# Patient Record
Sex: Male | Born: 1990
Health system: Southern US, Community
[De-identification: ages and names within clinical notes are randomized; demographics above are authoritative.]

## PROBLEM LIST (undated history)

## (undated) DIAGNOSIS — F419 Anxiety disorder, unspecified: Secondary | ICD-10-CM

## (undated) HISTORY — PX: SHOULDER SURGERY: SHX246

## (undated) HISTORY — PX: WISDOM TOOTH EXTRACTION: SHX21

## (undated) HISTORY — PX: KNEE SURGERY: SHX244

---

## 2015-06-16 ENCOUNTER — Emergency Department: Payer: No Typology Code available for payment source

## 2015-06-16 ENCOUNTER — Emergency Department
Admission: EM | Admit: 2015-06-16 | Discharge: 2015-06-16 | Disposition: A | Discharge status: Home / Self Care | Payer: No Typology Code available for payment source | Attending: Emergency Medicine | Admitting: Emergency Medicine

## 2015-06-16 ENCOUNTER — Encounter: Payer: Self-pay | Admitting: Emergency Medicine

## 2015-06-16 DIAGNOSIS — F17200 Nicotine dependence, unspecified, uncomplicated: Secondary | ICD-10-CM | POA: Diagnosis not present

## 2015-06-16 DIAGNOSIS — R1010 Upper abdominal pain, unspecified: Secondary | ICD-10-CM | POA: Diagnosis present

## 2015-06-16 DIAGNOSIS — K2970 Gastritis, unspecified, without bleeding: Secondary | ICD-10-CM | POA: Diagnosis not present

## 2015-06-16 LAB — COMPREHENSIVE METABOLIC PANEL
ALT: 53 U/L (ref 17–63)
AST: 36 U/L (ref 15–41)
Albumin: 4.8 g/dL (ref 3.5–5.0)
Alkaline Phosphatase: 99 U/L (ref 38–126)
Anion gap: 9 (ref 5–15)
BUN: 11 mg/dL (ref 6–20)
CO2: 23 mmol/L (ref 22–32)
Calcium: 9.6 mg/dL (ref 8.9–10.3)
Chloride: 108 mmol/L (ref 101–111)
Creatinine, Ser: 0.74 mg/dL (ref 0.61–1.24)
GFR calc Af Amer: >60 mL/min (ref >60)
GFR calc non Af Amer: >60 mL/min (ref >60)
Glucose, Bld: 102 mg/dL — ABNORMAL HIGH (ref 65–99)
Potassium: 3.2 mmol/L — ABNORMAL LOW (ref 3.5–5.1)
Sodium: 140 mmol/L (ref 135–145)
Total Bilirubin: 0.9 mg/dL (ref 0.3–1.2)
Total Protein: 7.4 g/dL (ref 6.5–8.1)

## 2015-06-16 LAB — CBC
HCT: 46.9 % (ref 40.0–52.0)
Hemoglobin: 16.2 g/dL (ref 13.0–18.0)
MCH: 30.4 pg (ref 26.0–34.0)
MCHC: 34.6 g/dL (ref 32.0–36.0)
MCV: 87.8 fL (ref 80.0–100.0)
Platelets: 244 10*3/uL (ref 150–440)
RBC: 5.34 MIL/uL (ref 4.40–5.90)
RDW: 12.5 % (ref 11.5–14.5)
WBC: 19.3 10*3/uL — ABNORMAL HIGH (ref 3.8–10.6)

## 2015-06-16 LAB — URINALYSIS COMPLETE WITH MICROSCOPIC (ARMC ONLY)
Bacteria, UA: NONE SEEN
Bilirubin Urine: NEGATIVE
Glucose, UA: NEGATIVE mg/dL
Hgb urine dipstick: NEGATIVE
Ketones, ur: NEGATIVE mg/dL
Leukocytes, UA: NEGATIVE
Nitrite: NEGATIVE
Protein, ur: NEGATIVE mg/dL
Specific Gravity, Urine: 1.018 (ref 1.005–1.030)
pH: 7 (ref 5.0–8.0)

## 2015-06-16 LAB — LIPASE, BLOOD: Lipase: 25 U/L (ref 11–51)

## 2015-06-16 MED ORDER — SODIUM CHLORIDE 0.9 % IV BOLUS (SEPSIS)
1000.0000 mL | Freq: Once | INTRAVENOUS | Status: AC
  Administered 2015-06-16: 1000 mL via INTRAVENOUS

## 2015-06-16 MED ORDER — HYDROMORPHONE HCL 1 MG/ML IJ SOLN
1.0000 mg | Freq: Once | INTRAMUSCULAR | Status: AC
  Administered 2015-06-16: 1 mg via INTRAVENOUS
  Filled 2015-06-16: qty 1

## 2015-06-16 MED ORDER — ONDANSETRON 4 MG PO TBDP: 4.0000 mg | ORAL_TABLET | Freq: Three times a day (TID) | ORAL | Status: DC | PRN

## 2015-06-16 MED ORDER — PROMETHAZINE HCL 25 MG/ML IJ SOLN
INTRAMUSCULAR | Status: AC
  Administered 2015-06-16: 25 mg via INTRAVENOUS
  Filled 2015-06-16: qty 1

## 2015-06-16 MED ORDER — PANTOPRAZOLE SODIUM 40 MG PO TBEC: 40.0000 mg | DELAYED_RELEASE_TABLET | Freq: Every day | ORAL | Status: DC

## 2015-06-16 MED ORDER — GI COCKTAIL ~~LOC~~
ORAL | Status: AC
  Administered 2015-06-16: 30 mL via ORAL
  Filled 2015-06-16: qty 30

## 2015-06-16 MED ORDER — MORPHINE SULFATE (PF) 4 MG/ML IV SOLN
INTRAVENOUS | Status: AC
  Administered 2015-06-16: 4 mg via INTRAVENOUS
  Filled 2015-06-16: qty 1

## 2015-06-16 MED ORDER — OXYCODONE-ACETAMINOPHEN 5-325 MG PO TABS: 1.0000 | ORAL_TABLET | Freq: Four times a day (QID) | ORAL | Status: DC | PRN

## 2015-06-16 MED ORDER — MORPHINE SULFATE (PF) 4 MG/ML IV SOLN
4.0000 mg | Freq: Once | INTRAVENOUS | Status: AC
  Administered 2015-06-16: 4 mg via INTRAVENOUS

## 2015-06-16 MED ORDER — GI COCKTAIL ~~LOC~~
30.0000 mL | Freq: Once | ORAL | Status: AC
  Administered 2015-06-16: 30 mL via ORAL

## 2015-06-16 MED ORDER — PROMETHAZINE HCL 25 MG/ML IJ SOLN
25.0000 mg | Freq: Once | INTRAMUSCULAR | Status: AC
  Administered 2015-06-16: 25 mg via INTRAVENOUS
  Filled 2015-06-16: qty 1

## 2015-06-16 NOTE — ED Provider Notes (Signed)
[facility] Emergency Department Provider Note  Time seen: 4:49 PM  I have reviewed the triage vital signs and the nursing notes.   HISTORY  Chief Complaint Abdominal Pain    HPI Brett [name] is a 24 y.o. male with no past medical history who presents the emergency department for abdominal pain, nausea and vomiting. According to the patient and [place], however over the past one month the patient has been experiencing upper abdominal pain, nausea and vomiting. States symptoms initially began around Thanksgiving where he was seen in the emergency department twice, without a diagnosis. States he had a CT scan with contrast which showed normal results. He then had an ultrasound showing a normal gallbladder. Patient states he was then admitted to the hospital and had a HIDA scan which showed normal results. He states this could be gastritis of a sent home with GI follow-up, and the patient followed up with GI as an outpatient where he had an endoscopy, which per patient showed normal results but they did tell him to limit his Advil/ibuprofen use. Patient states his symptoms have been present but had been mild over the past several weeks until today when his symptoms have flared up again. Prior to Thanksgiving the patient denies any history of this happening. Denies fever or diarrhea. Denies obvious hematuria or dysuria. No history of kidney stones. Patient states occasional alcohol use. Occasional marijuana use. Patient has discontinued his NSAID use, but still with symptoms. Describes his pain as severe located in the epigastrium and right upper quadrant.     History reviewed. No pertinent past medical history.  There are no active problems to display for this patient.   History reviewed. No pertinent past surgical history.  No current outpatient prescriptions on file.  Allergies Tamiflu  History reviewed. No pertinent family  history.  Social History Social History  Substance Use Topics  . Smoking status: Current Every Day Smoker  . Smokeless tobacco: None  . Alcohol Use: None    Review of Systems Constitutional: Negative for fever. Cardiovascular: Negative for chest pain. Respiratory: Negative for shortness of breath. Gastrointestinal: Positive for upper abdominal pain. Positive for nausea and vomiting. Negative for diarrhea or constipation. Genitourinary: Negative for dysuria. Negative for hematuria. Musculoskeletal: Negative for back pain Neurological: Negative for headache 10-point ROS otherwise negative.  ____________________________________________   PHYSICAL EXAM:  VITAL SIGNS: ED Triage Vitals  Enc Vitals Group     BP 06/16/15 1552 128/104 mmHg     Pulse Rate 06/16/15 1552 115     Resp 06/16/15 1552 24     Temp 06/16/15 1552 97.6 F (36.4 C)     Temp Source 06/16/15 1552 Oral     SpO2 06/16/15 1552 99 %     Weight 06/16/15 1552 190 lb (86.183 kg)     Height 06/16/15 1552  (1.88 m)     Head Cir --      Peak Flow --      Pain Score 06/16/15 1553 10     Pain Loc --      Pain Edu? --      Excl. in GC? --     Constitutional: Alert and oriented. Moderate distress due to pain. Eyes: Normal exam ENT   Head: Normocephalic and atraumatic   Mouth/Throat: Mucous membranes are moist. Cardiovascular: Normal rate, regular rhythm. No murmur Respiratory: Normal respiratory effort without tachypnea nor retractions. Breath sounds are clear and equal bilaterally. No wheezes/rales/rhonchi. Gastrointestinal:  Soft, moderate epigastric tenderness palpation. No rebound or guarding. No distention. Musculoskeletal: Nontender with normal range of motion in all extremities. Neurologic:  Normal speech and language. No gross focal neurologic deficits Skin:  Skin is warm, dry and intact.  Psychiatric: Mood and affect are normal. Speech and behavior are normal.    ____________________________________________     RADIOLOGY  CT shows no acute abnormality, 4 mm right lung nodule discussed with patient.  ____________________________________________    INITIAL IMPRESSION / ASSESSMENT AND PLAN / ED COURSE  Pertinent labs & imaging results that were available during my care of the patient were reviewed by me and considered in my medical decision making (see chart for details).  She presents for 1 month of epigastric pain which has been intermittent, but much more severe today also associated with nausea and vomiting. Patient appears to have had a very extensive workup including CT scan with contrast, ultrasound of his gallbladder, HIDA scan, endoscopy, all showing normal results per patient. Patient's labs today show a fairly elevated white blood cell count of 19,000, the rest of his labs are within normal limits including his LFTs. Lipase is normal. Currently awaiting urinalysis results. We'll treat the patient's pain and discomfort, and continue to closely monitor him in the emergency department.  Pain and nausea are much improved at this time. We will discharge the patient home with Protonix, Percocet and Zofran. I discussed with the patient and girlfriend the need to take Maalox for symptom control as well. Patient's follow-up with GI medicine when he returns back home. Discussed strict return precautions to which they are agreeable. Likely Gastritis.  ____________________________________________   FINAL CLINICAL IMPRESSION(S) / ED DIAGNOSES  Epigastric pain Gastritis  Minna AntisKevin Takeem Krotzer, MD 06/16/15 [phone]

## 2015-06-16 NOTE — Discharge Instructions (Signed)

## 2015-06-16 NOTE — ED Notes (Signed)
EMS pt to the lobby for RUQ abd pain , was given 4 mg zofran by EMS , recent hx of same with eval at a different hospital

## 2018-09-26 ENCOUNTER — Ambulatory Visit
Admission: EM | Admit: 2018-09-26 | Discharge: 2018-09-26 | Disposition: A | Discharge status: Home / Self Care | Payer: BLUE CROSS/BLUE SHIELD | Attending: Emergency Medicine | Admitting: Emergency Medicine

## 2018-09-26 ENCOUNTER — Encounter: Payer: Self-pay | Admitting: Emergency Medicine

## 2018-09-26 ENCOUNTER — Other Ambulatory Visit: Payer: Self-pay

## 2018-09-26 ENCOUNTER — Ambulatory Visit (INDEPENDENT_AMBULATORY_CARE_PROVIDER_SITE_OTHER)
Admit: 2018-09-26 | Discharge: 2018-09-26 | Disposition: A | Discharge status: Home / Self Care | Payer: BLUE CROSS/BLUE SHIELD | Attending: Family Medicine | Admitting: Family Medicine

## 2018-09-26 DIAGNOSIS — R1011 Right upper quadrant pain: Secondary | ICD-10-CM | POA: Diagnosis not present

## 2018-09-26 DIAGNOSIS — R112 Nausea with vomiting, unspecified: Secondary | ICD-10-CM | POA: Diagnosis not present

## 2018-09-26 DIAGNOSIS — F17210 Nicotine dependence, cigarettes, uncomplicated: Secondary | ICD-10-CM | POA: Diagnosis not present

## 2018-09-26 LAB — CBC WITH DIFFERENTIAL/PLATELET
Abs Immature Granulocytes: 0.03 10*3/uL (ref 0.00–0.07)
Basophils Absolute: 0 10*3/uL (ref 0.0–0.1)
Basophils Relative: 0 %
Eosinophils Absolute: 0.1 10*3/uL (ref 0.0–0.5)
Eosinophils Relative: 1 %
HCT: 47.6 % (ref 39.0–52.0)
Hemoglobin: 16.6 g/dL (ref 13.0–17.0)
Immature Granulocytes: 0 %
Lymphocytes Relative: 15 %
Lymphs Abs: 1.5 10*3/uL (ref 0.7–4.0)
MCH: 31.1 pg (ref 26.0–34.0)
MCHC: 34.9 g/dL (ref 30.0–36.0)
MCV: 89.3 fL (ref 80.0–100.0)
Monocytes Absolute: 0.6 10*3/uL (ref 0.1–1.0)
Monocytes Relative: 6 %
Neutro Abs: 7.9 10*3/uL — ABNORMAL HIGH (ref 1.7–7.7)
Neutrophils Relative %: 78 %
Platelets: 278 10*3/uL (ref 150–400)
RBC: 5.33 MIL/uL (ref 4.22–5.81)
RDW: 11.9 % (ref 11.5–15.5)
WBC: 10.1 10*3/uL (ref 4.0–10.5)
nRBC: 0 % (ref 0.0–0.2)

## 2018-09-26 LAB — COMPREHENSIVE METABOLIC PANEL
ALT: 49 U/L — ABNORMAL HIGH (ref 0–44)
AST: 33 U/L (ref 15–41)
Albumin: 4.9 g/dL (ref 3.5–5.0)
Alkaline Phosphatase: 90 U/L (ref 38–126)
Anion gap: 9 (ref 5–15)
BUN: 14 mg/dL (ref 6–20)
CO2: 25 mmol/L (ref 22–32)
Calcium: 9.4 mg/dL (ref 8.9–10.3)
Chloride: 105 mmol/L (ref 98–111)
Creatinine, Ser: 0.8 mg/dL (ref 0.61–1.24)
GFR calc Af Amer: >60 mL/min (ref >60)
GFR calc non Af Amer: >60 mL/min (ref >60)
Glucose, Bld: 94 mg/dL (ref 70–99)
Potassium: 4.2 mmol/L (ref 3.5–5.1)
Sodium: 139 mmol/L (ref 135–145)
Total Bilirubin: 1 mg/dL (ref 0.3–1.2)
Total Protein: 7.8 g/dL (ref 6.5–8.1)

## 2018-09-26 LAB — LIPASE, BLOOD: Lipase: 23 U/L (ref 11–51)

## 2018-09-26 MED ORDER — ONDANSETRON 4 MG PO TBDP: 4.0000 mg | ORAL_TABLET | Freq: Three times a day (TID) | ORAL | 0 refills | Status: DC | PRN

## 2018-09-26 NOTE — Discharge Instructions (Signed)
Take medication as prescribed. Rest. Drink plenty of fluids. Low fat diet.   Follow up with Gastroenterology this week as discussed. See above to call to schedule.  Follow up with your primary care physician this week as needed. Return to Urgent care for new or worsening concerns.

## 2018-09-26 NOTE — ED Provider Notes (Signed)
[facility] ____________________________________________  Time seen: Approximately 10:33 AM  I have reviewed the triage vital signs and the nursing notes.   HISTORY  Chief Complaint Abdominal Pain and Emesis   HPI Brett [name] is a 28 y.o. male presenting for evaluation of right upper quadrant abdominal pain and vomiting.  States yesterday morning after eating he began having right upper quadrant pain within an hour with followed vomiting.  Patient states that since then each time he eats he will have right upper quadrant abdominal pain within 1 hour, with nausea following and possible vomiting.  States if he does not eat or drink he feels better with only a very mild dull ache to right upper quadrant.  States minimal dull pain to right upper quadrant currently.  No current nausea.  Last bowel movement within the last 2 days and described as normal.  Denies abnormal colored vomit.  Previous history of gastritis with epigastric pain, states current feels different.  Denies known sick contacts.  No accompanying cough, fever, sore throat, chest pain, shortness of breath or other complaints.  No dysuria.  Denies pain radiation or flank pain.  No alleviating measures attempted otherwise.  Denies other aggravating factors.  Does not drink alcohol.   pertinent past medical history Gastritis   There are no active problems to display for this patient.   Past Surgical History:  Procedure Laterality Date  . KNEE SURGERY Bilateral   . SHOULDER SURGERY Left   . WISDOM TOOTH EXTRACTION       No current facility-administered medications for this encounter.   Current Outpatient Medications:  .  pantoprazole (PROTONIX) 40 MG tablet, Take 1 tablet (40 mg total) by mouth daily., Disp: 30 tablet, Rfl: 1 .  ondansetron (ZOFRAN ODT) 4 MG disintegrating tablet, Take 1 tablet (4 mg total) by mouth every 8 (eight) hours as needed., Disp: 15 tablet, Rfl: 0  Allergies Tamiflu [oseltamivir  phosphate]  Family History  Problem Relation Age of Onset  . Hypertension Mother   . Hypertension Father   . Diabetes Father   Mother cholecystectomy Sister cholecystectomy  Social History Social History   Tobacco Use  . Smoking status: Current Every Day Smoker    Types: Cigarettes  . Smokeless tobacco: Current User    Types: Chew  Substance Use Topics  . Alcohol use: Not Currently  . Drug use: Yes    Types: Marijuana    Review of Systems Constitutional: No fever Cardiovascular: Denies chest pain. Respiratory: Denies shortness of breath. Gastrointestinal: As above.  Genitourinary: Negative for dysuria. Musculoskeletal: Negative for back pain. Skin: Negative for rash.  ____________________________________________   PHYSICAL EXAM:  VITAL SIGNS: ED Triage Vitals  Enc Vitals Group     BP 09/26/18 0952 (!) 127/92     Pulse Rate 09/26/18 0952 82     Resp 09/26/18 0952 16     Temp 09/26/18 0952 98.4 F (36.9 C)     Temp Source 09/26/18 0952 Oral     SpO2 09/26/18 0952 100 %     Weight 09/26/18 0947 217 lb (98.4 kg)     Height 09/26/18 0947 6' (1.829 m)     Head Circumference --      Peak Flow --      Pain Score 09/26/18 0947 6     Pain Loc --      Pain Edu? --      Excl. in GC? --     Constitutional: Alert and oriented. Well appearing and  in no acute distress. ENT      Head: Normocephalic and atraumatic. Cardiovascular: Normal rate, regular rhythm. Grossly normal heart sounds.  Good peripheral circulation. Respiratory: Normal respiratory effort without tachypnea nor retractions. Breath sounds are clear and equal bilaterally. No wheezes, rales, rhonchi. Gastrointestinal:  Normal Bowel sounds.  No distention.  Moderate right upper quadrant tenderness with positive Murphys.  Abdomen otherwise soft and nontender.  No CVA tenderness. Musculoskeletal: No midline cervical, thoracic or lumbar tenderness to palpation.  Neurologic:  Normal speech and language. Speech  is normal. No gait instability.  Skin:  Skin is warm, dry and intact. No rash noted. Psychiatric: Mood and affect are normal. Speech and behavior are normal. Patient exhibits appropriate insight and judgment   ___________________________________________   LABS (all labs ordered are listed, but only abnormal results are displayed)  Labs Reviewed  CBC WITH DIFFERENTIAL/PLATELET - Abnormal; Notable for the following components:      Result Value   Neutro Abs 7.9 (*)    All other components within normal limits  COMPREHENSIVE METABOLIC PANEL - Abnormal; Notable for the following components:   ALT 49 (*)    All other components within normal limits  LIPASE, BLOOD    RADIOLOGY  US Abdomen Limited Ruq  Result Date: 09/26/2018 CLINICAL DATA:  Right upper quadrant pain, nausea and vomiting for 1 day. EXAM: ULTRASOUND ABDOMEN LIMITED RIGHT UPPER QUADRANT COMPARISON:  None. FINDINGS: Gallbladder: No gallstones or wall thickening visualized. No sonographic Murphy sign noted by sonographer. Common bile duct: Diameter: 0.4 cm. Liver: No focal lesion identified. Within normal limits in parenchymal echogenicity. Portal vein is patent on color Doppler imaging with normal direction of blood flow towards the liver. IMPRESSION: Negative exam. Electronically Signed   By: Drusilla Kanner M.D.   On: 09/26/2018 11:23   ____________________________________________   PROCEDURES Procedures   INITIAL IMPRESSION / ASSESSMENT AND PLAN / ED COURSE  Pertinent labs & imaging results that were available during my care of the patient were reviewed by me and considered in my medical decision making (see chart for details).  Well-appearing patient.  No acute distress.  Presenting for right upper quadrant abdominal pain with associated nausea and vomiting after eating.  Labs reviewed and overall unremarkable.  Right upper quadrant ultrasound performed and negative exam.  Abdomen otherwise soft and nontender.   Patient has minimal discomfort to right upper quadrant at this time.  Recommend for patient to have supportive care, Zofran as needed, decrease fat in diet and follow-up with gastroenterology due to concern gall pain.  Information given.  Work note given.  Discussed very strict follow-up and return parameters, proceed directly to the emergency room for increased pain.Discussed indication, risks and benefits of medications with patient.  Discussed follow up with Primary care physician this week. Discussed follow up and return parameters including no resolution or any worsening concerns. Patient verbalized understanding and agreed to plan.   ____________________________________________   FINAL CLINICAL IMPRESSION(S) / ED DIAGNOSES  Final diagnoses:  RUQ abdominal pain  Non-intractable vomiting with nausea, unspecified vomiting type     ED Discharge Orders         Ordered    ondansetron (ZOFRAN ODT) 4 MG disintegrating tablet  Every 8 hours PRN     09/26/18 1136           Note: This dictation was prepared with Dragon dictation along with smaller phrase technology. Any transcriptional errors that result from this process are unintentional.  Renford Dills, NP 09/26/18 1153

## 2018-09-26 NOTE — ED Triage Notes (Signed)
Patient states that yesterday morning when he was eating breakfast he started to have some right upper quadrant abdominal pain.  Patient also states that he vomited after he ate.  Patient states that his pain is worse after he eats.  Patient denies fevers.  Patient denies diarrhea.

## 2019-01-26 ENCOUNTER — Ambulatory Visit
Admission: EM | Admit: 2019-01-26 | Discharge: 2019-01-26 | Disposition: A | Discharge status: Home / Self Care | Payer: BLUE CROSS/BLUE SHIELD | Attending: Family Medicine | Admitting: Family Medicine

## 2019-01-26 ENCOUNTER — Other Ambulatory Visit: Payer: Self-pay

## 2019-01-26 ENCOUNTER — Encounter: Payer: Self-pay | Admitting: Emergency Medicine

## 2019-01-26 DIAGNOSIS — Z7189 Other specified counseling: Secondary | ICD-10-CM

## 2019-01-26 DIAGNOSIS — Z20822 Contact with and (suspected) exposure to covid-19: Secondary | ICD-10-CM

## 2019-01-26 DIAGNOSIS — Z20828 Contact with and (suspected) exposure to other viral communicable diseases: Secondary | ICD-10-CM

## 2019-01-26 NOTE — Discharge Instructions (Signed)
Refer to [facility] information and follow.  Monitor.  Await test results.  Remain home.  Follow up with your primary care physician this week as needed. Return to Urgent care for new or worsening concerns.

## 2019-01-26 NOTE — ED Provider Notes (Signed)
[facility] ____________________________________________  Time seen: Approximately 12:49 PM  I have reviewed the triage vital signs and the nursing notes.   HISTORY  Chief Complaint COVID Test   HPI Brett [name] is a 28 y.o. male presenting for COVID-19 testing.  Patient reports his wife was directly exposed to a positive contact through her work and he has been around his wife.  Patient denies any symptoms.  No fevers.Denies any nasal congestion, sore throat, fevers, chest pain, shortness of breath, changes in taste or smell, vomiting or diarrhea.  Has continued remain active.  Continues eat and drink well.  Denies pain or complaints at this time.   History reviewed. No pertinent past medical history.  There are no active problems to display for this patient.   Past Surgical History:  Procedure Laterality Date  . KNEE SURGERY Bilateral   . SHOULDER SURGERY Left   . WISDOM TOOTH EXTRACTION       No current facility-administered medications for this encounter.  No current outpatient medications on file.  Allergies Tamiflu [oseltamivir phosphate]  Family History  Problem Relation Age of Onset  . Hypertension Mother   . Hypertension Father   . Diabetes Father     Social History Social History   Tobacco Use  . Smoking status: Current Every Day Smoker    Types: Cigarettes  . Smokeless tobacco: Current User    Types: Chew  Substance Use Topics  . Alcohol use: Not Currently  . Drug use: Yes    Types: Marijuana    Review of Systems Constitutional: No fever ENT: No sore throat. Cardiovascular: Denies chest pain. Respiratory: Denies shortness of breath. Gastrointestinal: No abdominal pain.  No nausea, no vomiting.  No diarrhea.  Genitourinary: Negative for dysuria. Musculoskeletal: Negative for back pain. Skin: Negative for rash.   ____________________________________________   PHYSICAL EXAM:  VITAL SIGNS: ED Triage Vitals  Enc Vitals  Group     BP 01/26/19 1221 124/88     Pulse Rate 01/26/19 1221 68     Resp 01/26/19 1221 16     Temp 01/26/19 1221 98.2 F (36.8 C)     Temp Source 01/26/19 1221 Oral     SpO2 01/26/19 1221 100 %     Weight 01/26/19 1219 190 lb (86.2 kg)     Height 01/26/19 1219 6' (1.829 m)     Head Circumference --      Peak Flow --      Pain Score 01/26/19 1219 0     Pain Loc --      Pain Edu? --      Excl. in [place]? --     Constitutional: Alert and oriented. Well appearing and in no acute distress. Eyes: Conjunctivae are normal. ENT      Head: Normocephalic and atraumatic. Cardiovascular: Normal rate, regular rhythm. Grossly normal heart sounds.  Good peripheral circulation. Respiratory: Normal respiratory effort without tachypnea nor retractions. Breath sounds are clear and equal bilaterally. No wheezes, rales, rhonchi. Musculoskeletal: Steady gait.  Neurologic:  Normal speech and language.Speech is normal. No gait instability.  Skin:  Skin is warm, dry  Psychiatric: Mood and affect are normal. Speech and behavior are normal. Patient exhibits appropriate insight and judgment   ___________________________________________   LABS (all labs ordered are listed, but only abnormal results are displayed)  Labs Reviewed  NOVEL CORONAVIRUS, NAA (HOSPITAL ORDER, SEND-OUT TO REF LAB)    PROCEDURES Procedures    INITIAL IMPRESSION / ASSESSMENT AND PLAN / ED COURSE  Pertinent labs & imaging results that were available during my care of the patient were reviewed by me and considered in my medical decision making (see chart for details).  Well appearing patient.  Patient denies symptoms or complaints.  Positive exposure to COVID-19.  COVID-19 testing done.  [place] [facility] information given and instructed patient to await test results, remain home unless he can further care.  Monitor.   Discussed follow up and return parameters including no resolution or any worsening concerns. Patient verbalized  understanding and agreed to plan.   ____________________________________________   FINAL CLINICAL IMPRESSION(S) / ED DIAGNOSES  Final diagnoses:  Advice Given About Covid-19 Virus Infection  Exposure to Covid-19 Virus     ED Discharge Orders    None       Note: This dictation was prepared with Dragon dictation along with smaller phrase technology. Any transcriptional errors that result from this process are unintentional.         Renford DillsMiller, Clemmie Buelna, NP 01/26/19 1301

## 2019-01-26 NOTE — ED Triage Notes (Signed)
Patient states that he is here to be tested for the COVID 19 because his wife is being tested today and he needs a Negative test result before he can return to work.

## 2019-01-27 LAB — NOVEL CORONAVIRUS, NAA (HOSP ORDER, SEND-OUT TO REF LAB; TAT 18-24 HRS): SARS-CoV-2, NAA: NOT DETECTED

## 2019-01-28 ENCOUNTER — Encounter (HOSPITAL_COMMUNITY): Payer: Self-pay

## 2019-07-20 ENCOUNTER — Ambulatory Visit
Admission: EM | Admit: 2019-07-20 | Discharge: 2019-07-20 | Disposition: A | Discharge status: Home / Self Care | Payer: BLUE CROSS/BLUE SHIELD | Attending: Family Medicine | Admitting: Family Medicine

## 2019-07-20 ENCOUNTER — Encounter: Payer: Self-pay | Admitting: Emergency Medicine

## 2019-07-20 ENCOUNTER — Other Ambulatory Visit: Payer: Self-pay

## 2019-07-20 DIAGNOSIS — R112 Nausea with vomiting, unspecified: Secondary | ICD-10-CM

## 2019-07-20 DIAGNOSIS — J3489 Other specified disorders of nose and nasal sinuses: Secondary | ICD-10-CM | POA: Diagnosis not present

## 2019-07-20 DIAGNOSIS — Z20822 Contact with and (suspected) exposure to covid-19: Secondary | ICD-10-CM | POA: Diagnosis present

## 2019-07-20 MED ORDER — IPRATROPIUM BROMIDE 0.06 % NA SOLN: 2.0000 | Freq: Four times a day (QID) | NASAL | 0 refills | Status: DC | PRN

## 2019-07-20 NOTE — ED Provider Notes (Signed)
[facility]    CSN: [id number] Arrival date & time: 07/20/19  0904      History   Chief Complaint Chief Complaint  Patient presents with  . sinus pressure  . Nausea   HPI  29 year old Brett [name] presents with the above complaints.  Patient reports that he has had symptoms for the past 3 days.  He states that he has sinus pressure.  He describes a burning sensation in the nose.  He states that it feels like he has gotten water of his nose.  He has been trying over-the-counter medications without resolution.  He has had some nausea and vomiting today.  No fever.  No chills.  No known sick contacts.  No known exacerbating factors.  No other associated symptoms.  No other complaints.  History reviewed. No pertinent past medical history.  Past Surgical History:  Procedure Laterality Date  . KNEE SURGERY Bilateral   . SHOULDER SURGERY Left   . WISDOM TOOTH EXTRACTION     Home Medications    Prior to Admission medications   Medication Sig Start Date End Date Taking? Authorizing Provider  ipratropium (ATROVENT) 0.06 % nasal spray Place 2 sprays into both nostrils 4 (four) times daily as needed for rhinitis. 07/20/19   Tommie Sams, DO  pantoprazole (PROTONIX) 40 MG tablet Take 1 tablet (40 mg total) by mouth daily. 06/16/15 01/26/19  Minna Antis, MD    Family History Family History  Problem Relation Age of Onset  . Hypertension Mother   . Hypertension Father   . Diabetes Father     Social History Social History   Tobacco Use  . Smoking status: Current Every Day Smoker    Types: Cigarettes  . Smokeless tobacco: Current User    Types: Chew  Substance Use Topics  . Alcohol use: Not Currently  . Drug use: Yes    Types: Marijuana     Allergies   Tamiflu [oseltamivir phosphate]   Review of Systems Review of Systems  Constitutional: Negative for fever.  HENT: Positive for sinus pressure and sinus pain.   Gastrointestinal: Positive for nausea and  vomiting.    Physical Exam Triage Vital Signs ED Triage Vitals  Enc Vitals Group     BP 07/20/19 0926 136/89     Pulse Rate 07/20/19 0926 79     Resp 07/20/19 0926 18     Temp 07/20/19 0926 98.4 F (36.9 C)     Temp Source 07/20/19 0926 Oral     SpO2 07/20/19 0926 100 %     Weight 07/20/19 0924 180 lb (81.6 kg)     Height 07/20/19 0924 6\' 2"  (1.88 m)     Head Circumference --      Peak Flow --      Pain Score 07/20/19 0923 6     Pain Loc --      Pain Edu? --      Excl. in GC? --    Updated Vital Signs BP 136/89 (BP Location: Left Arm)   Pulse 79   Temp 98.4 F (36.9 C) (Oral)   Resp 18   Ht 6\' 2"  (1.88 m)   Wt 81.6 kg   SpO2 100%   BMI 23.11 kg/m   Visual Acuity Right Eye Distance:   Left Eye Distance:   Bilateral Distance:    Right Eye Near:   Left Eye Near:    Bilateral Near:     Physical Exam Vitals and nursing note reviewed.  Constitutional:  General: He is not in acute distress.    Appearance: Normal appearance. He is not ill-appearing.  HENT:     Head: Normocephalic and atraumatic.     Mouth/Throat:     Pharynx: Posterior oropharyngeal erythema present. No oropharyngeal exudate.  Eyes:     General:        Right eye: No discharge.        Left eye: No discharge.     Conjunctiva/sclera: Conjunctivae normal.  Cardiovascular:     Rate and Rhythm: Normal rate.     Heart sounds: No murmur.  Pulmonary:     Effort: Pulmonary effort is normal.     Breath sounds: Normal breath sounds. No rales.  Neurological:     Mental Status: He is alert.  Psychiatric:        Mood and Affect: Mood normal.        Behavior: Behavior normal.    UC Treatments / Results  Labs (all labs ordered are listed, but only abnormal results are displayed) Labs Reviewed  NOVEL CORONAVIRUS, NAA (HOSP ORDER, SEND-OUT TO REF LAB; TAT 18-24 HRS)    EKG   Radiology No results found.  Procedures Procedures (including critical care time)  Medications Ordered in UC  Medications - No data to display  Initial Impression / Assessment and Plan / UC Course  I have reviewed the triage vital signs and the nursing notes.  Pertinent labs & imaging results that were available during my care of the patient were reviewed by me and considered in my medical decision making (see chart for details).    29 year old Brett [name] presents with suspected COVID-19.  Atrovent nasal spray as directed.  Supportive care.  Final Clinical Impressions(s) / UC Diagnoses   Final diagnoses:  Suspected COVID-19 virus infection     Discharge Instructions     Rest.  Medication as prescribed.  COVID test results available in 24-48 hours.  Take care  Dr. Lacinda Axon    ED Prescriptions    Medication Sig Dispense Auth. Provider   ipratropium (ATROVENT) 0.06 % nasal spray Place 2 sprays into both nostrils 4 (four) times daily as needed for rhinitis. 15 mL Coral Spikes, DO     PDMP not reviewed this encounter.   Coral Spikes, [place] 07/20/19 1108

## 2019-07-20 NOTE — Discharge Instructions (Addendum)
Rest.  Medication as prescribed.  COVID test results available in 24-48 hours.  Take care  Dr. Adriana Simas

## 2019-07-20 NOTE — ED Triage Notes (Signed)
Pt c/o sinus pressure and a burning sensation in his sinuses. Started about 3 days ago and has been getting worse. He started having nausea yesterday. Denies fever.

## 2019-07-21 LAB — NOVEL CORONAVIRUS, NAA (HOSP ORDER, SEND-OUT TO REF LAB; TAT 18-24 HRS): SARS-CoV-2, NAA: NOT DETECTED

## 2019-12-07 ENCOUNTER — Other Ambulatory Visit: Payer: Self-pay

## 2019-12-07 ENCOUNTER — Ambulatory Visit
Admission: EM | Admit: 2019-12-07 | Discharge: 2019-12-07 | Disposition: A | Discharge status: Home / Self Care | Payer: BLUE CROSS/BLUE SHIELD | Attending: Emergency Medicine | Admitting: Emergency Medicine

## 2019-12-07 ENCOUNTER — Encounter: Payer: Self-pay | Admitting: Emergency Medicine

## 2019-12-07 DIAGNOSIS — L551 Sunburn of second degree: Secondary | ICD-10-CM

## 2019-12-07 DIAGNOSIS — L550 Sunburn of first degree: Secondary | ICD-10-CM | POA: Diagnosis not present

## 2019-12-07 HISTORY — DX: Anxiety disorder, unspecified: F41.9

## 2019-12-07 MED ORDER — IBUPROFEN 600 MG PO TABS: 600.0000 mg | ORAL_TABLET | Freq: Four times a day (QID) | ORAL | 0 refills | Status: DC | PRN

## 2019-12-07 MED ORDER — HYDROCODONE-ACETAMINOPHEN 5-325 MG PO TABS: 1.0000 | ORAL_TABLET | Freq: Four times a day (QID) | ORAL | 0 refills | Status: DC | PRN

## 2019-12-07 MED ORDER — TIZANIDINE HCL 4 MG PO TABS: 4.0000 mg | ORAL_TABLET | Freq: Three times a day (TID) | ORAL | 0 refills | Status: DC | PRN

## 2019-12-07 MED ORDER — PREDNISONE 10 MG (21) PO TBPK: ORAL_TABLET | ORAL | 0 refills | Status: DC

## 2019-12-07 NOTE — ED Triage Notes (Signed)
Patient in today c/o sunburn x 2 days. Patient c/o blister on his shoulders. Patient has tried OTC Aloe, lidocaine burn gel, Tylenol and Ibuprofen without relief.
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
## 2020-01-09 ENCOUNTER — Ambulatory Visit
Admission: EM | Admit: 2020-01-09 | Discharge: 2020-01-09 | Disposition: A | Discharge status: Home / Self Care | Payer: BLUE CROSS/BLUE SHIELD | Attending: Internal Medicine | Admitting: Internal Medicine

## 2020-01-09 ENCOUNTER — Other Ambulatory Visit: Payer: Self-pay

## 2020-01-09 ENCOUNTER — Encounter: Payer: Self-pay | Admitting: Emergency Medicine

## 2020-01-09 DIAGNOSIS — H60503 Unspecified acute noninfective otitis externa, bilateral: Secondary | ICD-10-CM | POA: Diagnosis not present

## 2020-01-09 MED ORDER — NEOMYCIN-POLYMYXIN-HC 3.5-10000-1 OT SUSP: 3.0000 [drp] | Freq: Three times a day (TID) | OTIC | 0 refills | Status: DC

## 2020-01-09 NOTE — ED Triage Notes (Signed)
Patient c/o bilateral ear pain and pressure for the past months.  Patient denies fevers.

## 2020-01-09 NOTE — ED Provider Notes (Signed)
[facility]    CSN: [id number] Arrival date & time: 01/09/20  6812      History   Chief Complaint Chief Complaint  Patient presents with  . Otalgia    HPI Brett [name] is a 29 y.o. male. who presents with bilateral ear pain R>L x 1 month. It is provoked when he wears his ear buds or touches the canal. Has not been swimming in a long time. Denies drainage from his ears, URI or fever.     Past Medical History:  Diagnosis Date  . Anxiety     There are no problems to display for this patient.   Past Surgical History:  Procedure Laterality Date  . KNEE SURGERY Bilateral   . SHOULDER SURGERY Left   . WISDOM TOOTH EXTRACTION         Home Medications    Prior to Admission medications   Medication Sig Start Date End Date Taking? Authorizing Provider  buPROPion (WELLBUTRIN XL) 300 MG 24 hr tablet Take 300 mg by mouth daily.   Yes [provider]  clonazePAM (KLONOPIN) 0.5 MG tablet Take 0.5 mg by mouth daily.   Yes [provider]  ibuprofen (ADVIL) 600 MG tablet Take 1 tablet (600 mg total) by mouth every 6 (six) hours as needed. 12/07/19   Domenick Gong, MD  neomycin-polymyxin-hydrocortisone (CORTISPORIN) 3.5-10000-1 OTIC suspension Place 3 drops into both ears 3 (three) times daily. 01/09/20   Rodriguez-Southworth, Nettie Elm, PA-C  ipratropium (ATROVENT) 0.06 % nasal spray Place 2 sprays into both nostrils 4 (four) times daily as needed for rhinitis. 07/20/19 12/07/19  Tommie Sams, DO  pantoprazole (PROTONIX) 40 MG tablet Take 1 tablet (40 mg total) by mouth daily. 06/16/15 01/26/19  Minna Antis, MD    Family History Family History  Problem Relation Age of Onset  . Hypertension Mother   . Hypertension Father   . Diabetes Father     Social History Social History   Tobacco Use  . Smoking status: Current Every Day Smoker    Packs/day: 0.25    Years: 10.00    Pack years: 2.50    Types: Cigarettes  . Smokeless tobacco:  Current User    Types: Chew  Vaping Use  . Vaping Use: Never used  Substance Use Topics  . Alcohol use: Yes    Comment: rarely  . Drug use: Yes    Frequency: 2.0 times per week    Types: Marijuana     Allergies   Tamiflu [oseltamivir phosphate]   Review of Systems Review of Systems  Constitutional: Negative for chills, diaphoresis and fever.  HENT: Positive for ear pain. Negative for congestion, ear discharge, hearing loss, postnasal drip, sneezing, tinnitus and trouble swallowing.   Eyes: Negative for discharge.  Respiratory: Negative for cough.   Gastrointestinal: Negative for nausea.  Musculoskeletal: Negative for gait problem.  Skin: Negative for rash.  Neurological: Negative for dizziness.  Hematological: Negative for adenopathy.     Physical Exam Triage Vital Signs ED Triage Vitals  Enc Vitals Group     BP 01/09/20 0836 (!) 152/107     Pulse Rate 01/09/20 0836 79     Resp 01/09/20 0836 16     Temp 01/09/20 0836 98.5 F (36.9 C)     Temp Source 01/09/20 0836 Oral     SpO2 01/09/20 0836 100 %     Weight 01/09/20 0835 180 lb (81.6 kg)     Height 01/09/20 0835 6\' 2"  (1.88 m)  Head Circumference --      Peak Flow --      Pain Score 01/09/20 0834 3     Pain Loc --      Pain Edu? --      Excl. in GC? --    No data found.  Updated Vital Signs BP (!) 152/107 (BP Location: Left Arm)   Pulse 79   Temp 98.5 F (36.9 C) (Oral)   Resp 16   Ht 6\' 2"  (1.88 m)   Wt 180 lb (81.6 kg)   SpO2 100%   BMI 23.11 kg/m   Visual Acuity Right Eye Distance:   Left Eye Distance:   Bilateral Distance:    Right Eye Near:   Left Eye Near:    Bilateral Near:     Physical Exam Physical Exam Vitals signs and nursing note reviewed.  Constitutional:      General: She is not in acute distress.    Appearance: Normal appearance. She is not ill-appearing, toxic-appearing or diaphoretic.  HENT:     Head: Normocephalic.     Right Ear: Tympanic membrane, ear canal and  external ear normal. Canal is a little red, swollen and tender.     Left Ear: Tympanic membrane, ear canal and external ear normal. Ear canal is tender, but not red or swollen.     Nose: Nose normal.     Mouth/Throat:     Mouth: Mucous membranes are moist.  Eyes:     General: No scleral icterus.       Right eye: No discharge.        Left eye: No discharge.     Conjunctiva/sclera: Conjunctivae normal.  Neck:     Musculoskeletal: Neck supple. No neck rigidity.  Cardiovascular:     Rate and Rhythm: Normal rate and regular rhythm.     Heart sounds: No murmur.  Pulmonary:     Effort: Pulmonary effort is normal.     Breath sounds: Normal breath sounds.   Musculoskeletal: Normal range of motion.  Lymphadenopathy:     Cervical: No cervical adenopathy.  Skin:    General: Skin is warm and dry.     Coloration: Skin is not jaundiced.     Findings: No rash.  Neurological:     Mental Status: She is alert and oriented to person, place, and time.     Gait: Gait normal.  Psychiatric:        Mood and Affect: Mood normal.        Behavior: Behavior normal.        Thought Content: Thought content normal.        Judgment: Judgment normal.     UC Treatments / Results  Labs (all labs ordered are listed, but only abnormal results are displayed) Labs Reviewed - No data to display  EKG   Radiology No results found.  Procedures Procedures (including critical care time)  Medications Ordered in UC Medications - No data to display  Initial Impression / Assessment and Plan / UC Course  I have reviewed the triage vital signs and the nursing notes. Has B OE.  He was placed on Cortisporin otic gtts as noted.   Final Clinical Impressions(s) / UC Diagnoses   Final diagnoses:  Acute otitis externa of both ears, unspecified type   Discharge Instructions   None    ED Prescriptions    Medication Sig Dispense Auth. Provider   neomycin-polymyxin-hydrocortisone (CORTISPORIN) 3.5-10000-1  OTIC suspension Place 3 drops into both ears  3 (three) times daily. 10 mL Rodriguez-Southworth, Nettie Elm, PA-C     PDMP not reviewed this encounter.   Garey Ham, [place] 01/09/20 [phone]

## 2020-02-28 ENCOUNTER — Other Ambulatory Visit: Payer: Self-pay

## 2020-02-28 ENCOUNTER — Ambulatory Visit
Admission: EM | Admit: 2020-02-28 | Discharge: 2020-02-28 | Disposition: A | Discharge status: Home / Self Care | Payer: BLUE CROSS/BLUE SHIELD | Attending: Emergency Medicine | Admitting: Emergency Medicine

## 2020-02-28 DIAGNOSIS — H9203 Otalgia, bilateral: Secondary | ICD-10-CM

## 2020-02-28 MED ORDER — IBUPROFEN 600 MG PO TABS: 600.0000 mg | ORAL_TABLET | Freq: Four times a day (QID) | ORAL | 0 refills | Status: DC | PRN

## 2020-02-28 NOTE — ED Triage Notes (Signed)
Patient states that he is having bilateral ear pain that started 1 week ago. Reports that right is worse than left. States that he was seen for this 6 weeks ago and treated with drops.

## 2020-02-28 NOTE — Discharge Instructions (Signed)
Your ears do not show signs of infection today.  Take the ibuprofen as needed for pain.    Follow up with your primary care provider if your symptoms are not improving.

## 2020-02-28 NOTE — ED Provider Notes (Signed)
[facility]    CSN: [id number] Arrival date & time: 02/28/20  9675      History   Chief Complaint Chief Complaint  Patient presents with  . Otalgia    HPI Brett [name] is a 29 y.o. male.   Patient presents with bilateral ear pain x1 week, R>L.  He states he had otitis externa about 6 weeks ago and was treated with eardrops; He states these resolved his symptoms at that time.  He denies fever, chills, sore throat, cough, shortness of breath, vomiting, diarrhea, rash, or other symptoms.  Treatment attempted at home with Tylenol.  The history is provided by the patient.    Past Medical History:  Diagnosis Date  . Anxiety     There are no problems to display for this patient.   Past Surgical History:  Procedure Laterality Date  . KNEE SURGERY Bilateral   . SHOULDER SURGERY Left   . WISDOM TOOTH EXTRACTION         Home Medications    Prior to Admission medications   Medication Sig Start Date End Date Taking? Authorizing Provider  buPROPion (WELLBUTRIN XL) 300 MG 24 hr tablet Take 300 mg by mouth daily.   Yes [provider]  clonazePAM (KLONOPIN) 0.5 MG tablet Take 0.5 mg by mouth daily.    [provider]  ibuprofen (ADVIL) 600 MG tablet Take 1 tablet (600 mg total) by mouth every 6 (six) hours as needed. 02/28/20   Mickie Bail, NP  neomycin-polymyxin-hydrocortisone (CORTISPORIN) 3.5-10000-1 OTIC suspension Place 3 drops into both ears 3 (three) times daily. 01/09/20   Rodriguez-Southworth, Nettie Elm, PA-C  ipratropium (ATROVENT) 0.06 % nasal spray Place 2 sprays into both nostrils 4 (four) times daily as needed for rhinitis. 07/20/19 12/07/19  Tommie Sams, DO  pantoprazole (PROTONIX) 40 MG tablet Take 1 tablet (40 mg total) by mouth daily. 06/16/15 01/26/19  Minna Antis, MD    Family History Family History  Problem Relation Age of Onset  . Hypertension Mother   . Hypertension Father   . Diabetes Father     Social History  Social History   Tobacco Use  . Smoking status: Current Every Day Smoker    Packs/day: 0.25    Years: 10.00    Pack years: 2.50    Types: Cigarettes  . Smokeless tobacco: Current User    Types: Chew  Vaping Use  . Vaping Use: Never used  Substance Use Topics  . Alcohol use: Yes    Comment: rarely  . Drug use: Yes    Frequency: 2.0 times per week    Types: Marijuana     Allergies   Tamiflu [oseltamivir phosphate]   Review of Systems Review of Systems  Constitutional: Negative for chills and fever.  HENT: Positive for ear pain. Negative for sore throat.   Eyes: Negative for pain and visual disturbance.  Respiratory: Negative for cough and shortness of breath.   Cardiovascular: Negative for chest pain and palpitations.  Gastrointestinal: Negative for abdominal pain and vomiting.  Genitourinary: Negative for dysuria and hematuria.  Musculoskeletal: Negative for arthralgias and back pain.  Skin: Negative for color change and rash.  Neurological: Negative for seizures and syncope.  All other systems reviewed and are negative.    Physical Exam Triage Vital Signs ED Triage Vitals  Enc Vitals Group     BP 02/28/20 0926 122/67     Pulse Rate 02/28/20 0926 63     Resp 02/28/20 0926 16  Temp 02/28/20 0926 98.2 F (36.8 C)     Temp Source 02/28/20 0926 Oral     SpO2 02/28/20 0926 100 %     Weight 02/28/20 0925 175 lb (79.4 kg)     Height 02/28/20 0925 6\' 2"  (1.88 m)     Head Circumference --      Peak Flow --      Pain Score 02/28/20 0924 7     Pain Loc --      Pain Edu? --      Excl. in GC? --    No data found.  Updated Vital Signs BP 122/67 (BP Location: Left Arm)   Pulse 63   Temp 98.2 F (36.8 C) (Oral)   Resp 16   Ht 6\' 2"  (1.88 m)   Wt 175 lb (79.4 kg)   SpO2 100%   BMI 22.47 kg/m   Visual Acuity Right Eye Distance:   Left Eye Distance:   Bilateral Distance:    Right Eye Near:   Left Eye Near:    Bilateral Near:     Physical Exam  Vitals and nursing note reviewed.  Constitutional:      General: He is not in acute distress.    Appearance: He is well-developed. He is not ill-appearing.  HENT:     Head: Normocephalic and atraumatic.     Right Ear: Tympanic membrane, ear canal and external ear normal.     Left Ear: Tympanic membrane, ear canal and external ear normal.     Nose: Nose normal.     Mouth/Throat:     Mouth: Mucous membranes are moist.     Pharynx: Oropharynx is clear.  Eyes:     Conjunctiva/sclera: Conjunctivae normal.  Cardiovascular:     Rate and Rhythm: Normal rate and regular rhythm.     Heart sounds: No murmur heard.   Pulmonary:     Effort: Pulmonary effort is normal. No respiratory distress.     Breath sounds: Normal breath sounds.  Abdominal:     Palpations: Abdomen is soft.     Tenderness: There is no abdominal tenderness. There is no guarding.  Musculoskeletal:     Cervical back: Neck supple.  Skin:    General: Skin is warm and dry.     Findings: No rash.  Neurological:     General: No focal deficit present.     Mental Status: He is alert and oriented to person, place, and time.     Gait: Gait normal.  Psychiatric:        Mood and Affect: Mood normal.        Behavior: Behavior normal.      UC Treatments / Results  Labs (all labs ordered are listed, but only abnormal results are displayed) Labs Reviewed - No data to display  EKG   Radiology No results found.  Procedures Procedures (including critical care time)  Medications Ordered in UC Medications - No data to display  Initial Impression / Assessment and Plan / UC Course  I have reviewed the triage vital signs and the nursing notes.  Pertinent labs & imaging results that were available during my care of the patient were reviewed by me and considered in my medical decision making (see chart for details).   Bilateral otalgia.  No signs of infection today.  Treating with ibuprofen.  Instructed patient to follow-up  with his PCP if symptoms are not improving.  Patient agrees to plan of care.   Final Clinical Impressions(s) /  UC Diagnoses   Final diagnoses:  Otalgia of both ears     Discharge Instructions     Your ears do not show signs of infection today.  Take the ibuprofen as needed for pain.    Follow up with your primary care provider if your symptoms are not improving.        ED Prescriptions    Medication Sig Dispense Auth. Provider   ibuprofen (ADVIL) 600 MG tablet Take 1 tablet (600 mg total) by mouth every 6 (six) hours as needed. 30 tablet Mickie Bail, NP     PDMP not reviewed this encounter.   Mickie Bail, NP 02/28/20 1015

## 2020-03-16 ENCOUNTER — Ambulatory Visit
Admission: EM | Admit: 2020-03-16 | Discharge: 2020-03-16 | Disposition: A | Discharge status: Home / Self Care | Payer: BLUE CROSS/BLUE SHIELD | Attending: Emergency Medicine | Admitting: Emergency Medicine

## 2020-03-16 DIAGNOSIS — H6691 Otitis media, unspecified, right ear: Secondary | ICD-10-CM

## 2020-03-16 MED ORDER — AMOXICILLIN 875 MG PO TABS: 875.0000 mg | ORAL_TABLET | Freq: Two times a day (BID) | ORAL | 0 refills | Status: AC

## 2020-03-16 NOTE — ED Provider Notes (Signed)
Renaldo Fiddler    CSN: [id number] Arrival date & time: 03/16/20  1708      History   Chief Complaint Chief Complaint  Patient presents with  . Otalgia    HPI Brett [name] is a 29 y.o. male.   Patient presents with bilateral ear pain, R>L, x 1 day.  He denies fever, chills, sore throat, cough, shortness of breath, vomiting, diarrhea, or other symptoms.  Treatment attempted at home with OTC pain reliever.  Patient was seen at [facility] urgent care on 02/28/2020; diagnosed with bilateral otalgia; treated with ibuprofen.  The history is provided by the patient.    Past Medical History:  Diagnosis Date  . Anxiety     There are no problems to display for this patient.   Past Surgical History:  Procedure Laterality Date  . KNEE SURGERY Bilateral   . SHOULDER SURGERY Left   . WISDOM TOOTH EXTRACTION         Home Medications    Prior to Admission medications   Medication Sig Start Date End Date Taking? Authorizing Provider  buPROPion (WELLBUTRIN XL) 300 MG 24 hr tablet Take 300 mg by mouth daily.   Yes [provider]  amoxicillin (AMOXIL) 875 MG tablet Take 1 tablet (875 mg total) by mouth 2 (two) times daily for 7 days. 03/16/20 03/23/20  Mickie Bail, NP  clonazePAM (KLONOPIN) 0.5 MG tablet Take 0.5 mg by mouth daily.    [provider]  ibuprofen (ADVIL) 600 MG tablet Take 1 tablet (600 mg total) by mouth every 6 (six) hours as needed. 02/28/20   Mickie Bail, NP  neomycin-polymyxin-hydrocortisone (CORTISPORIN) 3.5-10000-1 OTIC suspension Place 3 drops into both ears 3 (three) times daily. 01/09/20   Rodriguez-Southworth, Nettie Elm, PA-C  ipratropium (ATROVENT) 0.06 % nasal spray Place 2 sprays into both nostrils 4 (four) times daily as needed for rhinitis. 07/20/19 12/07/19  Tommie Sams, DO  pantoprazole (PROTONIX) 40 MG tablet Take 1 tablet (40 mg total) by mouth daily. 06/16/15 01/26/19  Minna Antis, MD    Family History Family History    Problem Relation Age of Onset  . Hypertension Mother   . Hypertension Father   . Diabetes Father     Social History Social History   Tobacco Use  . Smoking status: Former Smoker    Packs/day: 0.25    Years: 10.00    Pack years: 2.50    Types: Cigarettes    Quit date: 12/31/2019    Years since quitting: 0.2  . Smokeless tobacco: Current User    Types: Chew  Vaping Use  . Vaping Use: Every day  . Substances: Nicotine-salt  Substance Use Topics  . Alcohol use: Yes    Comment: rarely  . Drug use: Yes    Frequency: 2.0 times per week    Types: Marijuana     Allergies   Tamiflu [oseltamivir phosphate]   Review of Systems Review of Systems  Constitutional: Negative for chills and fever.  HENT: Positive for ear pain. Negative for sore throat.   Eyes: Negative for pain and visual disturbance.  Respiratory: Negative for cough and shortness of breath.   Cardiovascular: Negative for chest pain and palpitations.  Gastrointestinal: Negative for abdominal pain and vomiting.  Genitourinary: Negative for dysuria and hematuria.  Musculoskeletal: Negative for arthralgias and back pain.  Skin: Negative for color change and rash.  Neurological: Negative for seizures and syncope.  All other systems reviewed and are negative.    Physical  Exam Triage Vital Signs ED Triage Vitals [03/16/20 1721]  Enc Vitals Group     BP      Pulse      Resp      Temp      Temp src      SpO2      Weight      Height      Head Circumference      Peak Flow      Pain Score 8     Pain Loc      Pain Edu?      Excl. in GC?    No data found.  Updated Vital Signs BP 132/90   Pulse 80   Temp 98.8 F (37.1 C)   Resp 14   SpO2 97%   Visual Acuity Right Eye Distance:   Left Eye Distance:   Bilateral Distance:    Right Eye Near:   Left Eye Near:    Bilateral Near:     Physical Exam Vitals and nursing note reviewed.  Constitutional:      General: He is not in acute distress.     Appearance: He is well-developed.  HENT:     Head: Normocephalic and atraumatic.     Right Ear: Ear canal normal. Tympanic membrane is erythematous.     Left Ear: Tympanic membrane and ear canal normal.     Nose: Nose normal.     Mouth/Throat:     Mouth: Mucous membranes are moist.     Pharynx: Oropharynx is clear.  Eyes:     Conjunctiva/sclera: Conjunctivae normal.  Cardiovascular:     Rate and Rhythm: Normal rate and regular rhythm.     Heart sounds: No murmur heard.   Pulmonary:     Effort: Pulmonary effort is normal. No respiratory distress.     Breath sounds: Normal breath sounds.  Abdominal:     Palpations: Abdomen is soft.     Tenderness: There is no abdominal tenderness. There is no guarding or rebound.  Musculoskeletal:     Cervical back: Neck supple.  Skin:    General: Skin is warm and dry.     Findings: No rash.  Neurological:     General: No focal deficit present.     Mental Status: He is alert and oriented to person, place, and time.     Gait: Gait normal.  Psychiatric:        Mood and Affect: Mood normal.        Behavior: Behavior normal.      UC Treatments / Results  Labs (all labs ordered are listed, but only abnormal results are displayed) Labs Reviewed - No data to display  EKG   Radiology No results found.  Procedures Procedures (including critical care time)  Medications Ordered in UC Medications - No data to display  Initial Impression / Assessment and Plan / UC Course  I have reviewed the triage vital signs and the nursing notes.  Pertinent labs & imaging results that were available during my care of the patient were reviewed by me and considered in my medical decision making (see chart for details).   Right otitis media.  Treating with amoxicillin.  Tylenol or ibuprofen as needed for discomfort.  Instructed him to follow-up with his PCP if his symptoms are not improving.  Patient agrees to plan of care.   Final Clinical  Impressions(s) / UC Diagnoses   Final diagnoses:  Right otitis media, unspecified otitis media type  Discharge Instructions     Take the antibiotic as directed.    Follow up with your primary care provider if your symptoms are not improving.       ED Prescriptions    Medication Sig Dispense Auth. Provider   amoxicillin (AMOXIL) 875 MG tablet Take 1 tablet (875 mg total) by mouth 2 (two) times daily for 7 days. 14 tablet Mickie Bail, NP     PDMP not reviewed this encounter.   Mickie Bail, NP 03/16/20 1752

## 2020-03-16 NOTE — ED Triage Notes (Signed)
Patient c/o bilateral ear pain x1 day. Reports 8/10 pain. States pain is beginning to radiate from his right ear to his right jaw. Denies any other symptoms.

## 2020-03-16 NOTE — Discharge Instructions (Addendum)
Take the antibiotic as directed.  Follow up with your primary care provider if your symptoms are not improving.     

## 2020-03-21 ENCOUNTER — Other Ambulatory Visit: Payer: Self-pay

## 2020-03-21 DIAGNOSIS — Z20822 Contact with and (suspected) exposure to covid-19: Secondary | ICD-10-CM

## 2020-03-22 LAB — SARS-COV-2, NAA 2 DAY TAT

## 2020-03-22 LAB — NOVEL CORONAVIRUS, NAA: SARS-CoV-2, NAA: NOT DETECTED

## 2020-05-16 ENCOUNTER — Other Ambulatory Visit: Payer: Self-pay

## 2020-05-16 ENCOUNTER — Ambulatory Visit
Admission: EM | Admit: 2020-05-16 | Discharge: 2020-05-16 | Disposition: A | Discharge status: Home / Self Care | Payer: BLUE CROSS/BLUE SHIELD

## 2020-05-17 ENCOUNTER — Other Ambulatory Visit: Payer: Self-pay

## 2020-05-17 ENCOUNTER — Ambulatory Visit: Payer: Self-pay

## 2020-05-17 ENCOUNTER — Emergency Department: Payer: BLUE CROSS/BLUE SHIELD

## 2020-05-17 ENCOUNTER — Emergency Department
Admission: EM | Admit: 2020-05-17 | Discharge: 2020-05-17 | Disposition: A | Discharge status: Home / Self Care | Payer: BLUE CROSS/BLUE SHIELD | Attending: Emergency Medicine | Admitting: Emergency Medicine

## 2020-05-17 DIAGNOSIS — R1013 Epigastric pain: Secondary | ICD-10-CM

## 2020-05-17 DIAGNOSIS — F17220 Nicotine dependence, chewing tobacco, uncomplicated: Secondary | ICD-10-CM | POA: Diagnosis not present

## 2020-05-17 DIAGNOSIS — R112 Nausea with vomiting, unspecified: Secondary | ICD-10-CM | POA: Insufficient documentation

## 2020-05-17 DIAGNOSIS — F17290 Nicotine dependence, other tobacco product, uncomplicated: Secondary | ICD-10-CM | POA: Insufficient documentation

## 2020-05-17 DIAGNOSIS — K2960 Other gastritis without bleeding: Secondary | ICD-10-CM | POA: Insufficient documentation

## 2020-05-17 DIAGNOSIS — K2950 Unspecified chronic gastritis without bleeding: Secondary | ICD-10-CM

## 2020-05-17 LAB — COMPREHENSIVE METABOLIC PANEL
ALT: 26 U/L (ref 0–44)
AST: 21 U/L (ref 15–41)
Albumin: 5 g/dL (ref 3.5–5.0)
Alkaline Phosphatase: 75 U/L (ref 38–126)
Anion gap: 12 (ref 5–15)
BUN: 10 mg/dL (ref 6–20)
CO2: 26 mmol/L (ref 22–32)
Calcium: 9.7 mg/dL (ref 8.9–10.3)
Chloride: 102 mmol/L (ref 98–111)
Creatinine, Ser: 1.04 mg/dL (ref 0.61–1.24)
GFR, Estimated: >60 mL/min (ref >60)
Glucose, Bld: 106 mg/dL — ABNORMAL HIGH (ref 70–99)
Potassium: 4.2 mmol/L (ref 3.5–5.1)
Sodium: 140 mmol/L (ref 135–145)
Total Bilirubin: 1.5 mg/dL — ABNORMAL HIGH (ref 0.3–1.2)
Total Protein: 7.8 g/dL (ref 6.5–8.1)

## 2020-05-17 LAB — CBC
HCT: 45.9 % (ref 39.0–52.0)
Hemoglobin: 16.2 g/dL (ref 13.0–17.0)
MCH: 31.2 pg (ref 26.0–34.0)
MCHC: 35.3 g/dL (ref 30.0–36.0)
MCV: 88.3 fL (ref 80.0–100.0)
Platelets: 290 10*3/uL (ref 150–400)
RBC: 5.2 MIL/uL (ref 4.22–5.81)
RDW: 11.6 % (ref 11.5–15.5)
WBC: 13.5 10*3/uL — ABNORMAL HIGH (ref 4.0–10.5)
nRBC: 0 % (ref 0.0–0.2)

## 2020-05-17 LAB — LIPASE, BLOOD: Lipase: 48 U/L (ref 11–51)

## 2020-05-17 MED ORDER — SUCRALFATE 1 G PO TABS: 1.0000 g | ORAL_TABLET | Freq: Four times a day (QID) | ORAL | 1 refills | Status: DC

## 2020-05-17 MED ORDER — FAMOTIDINE 20 MG PO TABS: 20.0000 mg | ORAL_TABLET | Freq: Two times a day (BID) | ORAL | 1 refills | Status: AC

## 2020-05-17 MED ORDER — ONDANSETRON 4 MG PO TBDP: 4.0000 mg | ORAL_TABLET | Freq: Three times a day (TID) | ORAL | 0 refills | Status: DC | PRN

## 2020-05-17 MED ORDER — IOHEXOL 300 MG/ML  SOLN
100.0000 mL | Freq: Once | INTRAMUSCULAR | Status: AC | PRN
  Administered 2020-05-17: 100 mL via INTRAVENOUS

## 2020-05-17 NOTE — ED Notes (Signed)
Pt provided with water with MD permission. Pt denies further needs at this time. Pt A&O x4, NAD noted at this time. Pt resting in bed with SO at bedside.

## 2020-05-17 NOTE — ED Triage Notes (Signed)
Pt comes via POV from home with c/o right sided abdominal pain. Pt states this has been going on for 2 months. Pt states it has just since gotten worse. Pt states N/V/D.

## 2020-05-17 NOTE — ED Notes (Signed)
ED Provider at bedside. 

## 2020-05-17 NOTE — ED Provider Notes (Signed)
[facility] Emergency Department Provider Note   ____________________________________________   First MD Initiated Contact with Patient 05/17/20 [phone]     (approximate)  I have reviewed the triage vital signs and the nursing notes.   HISTORY  Chief Complaint Abdominal Pain    HPI Brett [name] is a 29 y.o. male with a stated past medical history of anxiety and gastritis who presents for midepigastric abdominal pain that is been intermittent but worsening over the last 2 months and is described as burning, midepigastric, nonradiating, 9/10 pain that is slightly worsened with food.  Patient denies any relieving factors.  Endorses associated nausea/vomiting         Past Medical History:  Diagnosis Date  . Anxiety     There are no problems to display for this patient.   Past Surgical History:  Procedure Laterality Date  . KNEE SURGERY Bilateral   . SHOULDER SURGERY Left   . WISDOM TOOTH EXTRACTION      Prior to Admission medications   Medication Sig Start Date End Date Taking? Authorizing Provider  buPROPion (WELLBUTRIN XL) 300 MG 24 hr tablet Take 300 mg by mouth daily.    [provider]  clonazePAM (KLONOPIN) 0.5 MG tablet Take 0.5 mg by mouth daily.    [provider]  famotidine (PEPCID) 20 MG tablet Take 1 tablet (20 mg total) by mouth 2 (two) times daily. 05/17/20 05/17/21  Merwyn Katos, MD  ibuprofen (ADVIL) 600 MG tablet Take 1 tablet (600 mg total) by mouth every 6 (six) hours as needed. 02/28/20   Mickie Bail, NP  neomycin-polymyxin-hydrocortisone (CORTISPORIN) 3.5-10000-1 OTIC suspension Place 3 drops into both ears 3 (three) times daily. 01/09/20   Rodriguez-Southworth, Nettie Elm, PA-C  ondansetron (ZOFRAN ODT) 4 MG disintegrating tablet Take 1 tablet (4 mg total) by mouth every 8 (eight) hours as needed for nausea or vomiting. 05/17/20   Merwyn Katos, MD  sucralfate (CARAFATE) 1 g tablet Take 1 tablet (1 g  total) by mouth 4 (four) times daily. 05/17/20 05/17/21  Merwyn Katos, MD  ipratropium (ATROVENT) 0.06 % nasal spray Place 2 sprays into both nostrils 4 (four) times daily as needed for rhinitis. 07/20/19 12/07/19  Tommie Sams, DO  pantoprazole (PROTONIX) 40 MG tablet Take 1 tablet (40 mg total) by mouth daily. 06/16/15 01/26/19  Minna Antis, MD    Allergies Tamiflu [oseltamivir phosphate]  Family History  Problem Relation Age of Onset  . Hypertension Mother   . Hypertension Father   . Diabetes Father     Social History Social History   Tobacco Use  . Smoking status: Former Smoker    Packs/day: 0.25    Years: 10.00    Pack years: 2.50    Types: Cigarettes    Quit date: 12/31/2019    Years since quitting: 0.3  . Smokeless tobacco: Current User    Types: Chew  Vaping Use  . Vaping Use: Every day  . Substances: Nicotine-salt  Substance Use Topics  . Alcohol use: Yes    Comment: rarely  . Drug use: Yes    Frequency: 2.0 times per week    Types: Marijuana    Review of Systems Constitutional: No fever/chills Eyes: No visual changes. ENT: No sore throat. Cardiovascular: Denies chest pain. Respiratory: Denies shortness of breath. Gastrointestinal: Endorses abdominal pain.  Endorses nausea, no vomiting.  No diarrhea. Genitourinary: Negative for dysuria. Musculoskeletal: Negative for acute arthralgias Skin: Negative for rash. Neurological: Negative for headaches,  weakness/numbness/paresthesias in any extremity Psychiatric: Negative for suicidal ideation/homicidal ideation   ____________________________________________   PHYSICAL EXAM:  VITAL SIGNS: ED Triage Vitals [05/17/20 0814]  Enc Vitals Group     BP (!) 129/116     Pulse Rate 65     Resp 18     Temp 98.6 F (37 C)     Temp src      SpO2 100 %     Weight 175 lb (79.4 kg)     Height 6\' 2"  (1.88 m)     Head Circumference      Peak Flow      Pain Score 10     Pain Loc      Pain Edu?      Excl.  in GC?    Constitutional: Alert and oriented. Well appearing and in no acute distress. Eyes: Conjunctivae are normal. PERRL. Head: Atraumatic. Nose: No congestion/rhinnorhea. Mouth/Throat: Mucous membranes are moist. Neck: No stridor Cardiovascular: Grossly normal heart sounds.  Good peripheral circulation. Respiratory: Normal respiratory effort.  No retractions. Gastrointestinal: Soft.  Mild tenderness palpation midepigastric region. No distention. Musculoskeletal: No obvious deformities Neurologic:  Normal speech and language. No gross focal neurologic deficits are appreciated. Skin:  Skin is warm and dry. No rash noted. Psychiatric: Mood and affect are normal. Speech and behavior are normal.  ____________________________________________   LABS (all labs ordered are listed, but only abnormal results are displayed)  Labs Reviewed  COMPREHENSIVE METABOLIC PANEL - Abnormal; Notable for the following components:      Result Value   Glucose, Bld 106 (*)    Total Bilirubin 1.5 (*)    All other components within normal limits  CBC - Abnormal; Notable for the following components:   WBC 13.5 (*)    All other components within normal limits  LIPASE, BLOOD  URINALYSIS, COMPLETE (UACMP) WITH MICROSCOPIC    RADIOLOGY  ED MD interpretation: CT of the abdomen and pelvis with contrast shows wall thickening in the antrum of the stomach likely due to enteritis without any evidence of acute abscess, perforation, free air, or significant free fluid  Official radiology report(s): CT Abdomen Pelvis W Contrast  Result Date: 05/17/2020 CLINICAL DATA:  Right lower quadrant abdominal pain, worsening EXAM: CT ABDOMEN AND PELVIS WITH CONTRAST TECHNIQUE: Multidetector CT imaging of the abdomen and pelvis was performed using the standard protocol following bolus administration of intravenous contrast. CONTRAST:  05/19/2020 OMNIPAQUE IOHEXOL 300 MG/ML  SOLN COMPARISON:  06/16/2015 FINDINGS: Lower chest: 0.5  by 0.3 cm right middle lobe nodule on image 2 of series 4, stable from 2016 and considered benign. Hepatobiliary: Unremarkable Pancreas: Unremarkable Spleen: Unremarkable Adrenals/Urinary Tract: Unremarkable Stomach/Bowel: Wall thickening in the antrum of the stomach is probably from nondistention although antritis is not excluded. The appendix is of normal caliber, measuring up to 0.6 cm in diameter. No significant periappendiceal inflammatory stranding. The terminal ileum appears unremarkable. No dilated bowel. Vascular/Lymphatic: Unremarkable Reproductive: Unremarkable Other: No supplemental non-categorized findings. Musculoskeletal: Unremarkable IMPRESSION: 1. A cause for the patient's right lower quadrant abdominal pain is not identified. The appendix is of normal caliber. 2. Wall thickening in the antrum of the stomach is probably from nondistention although antritis is not excluded. Electronically Signed   By: 2017 M.D.   On: 05/17/2020 10:31    ____________________________________________   PROCEDURES  Procedure(s) performed (including Critical Care):  .1-3 Lead EKG Interpretation Performed by: 05/19/2020, MD Authorized by: Merwyn Katos, MD     Interpretation:  normal     ECG rate:  75   ECG rate assessment: normal     Rhythm: sinus rhythm     Ectopy: none     Conduction: normal       ____________________________________________   INITIAL IMPRESSION / ASSESSMENT AND PLAN / ED COURSE  As part of my medical decision making, I reviewed the following data within the electronic MEDICAL RECORD NUMBER Nursing notes reviewed and incorporated, Labs reviewed, Old chart reviewed, Radiograph reviewed and Notes from prior ED visits reviewed and incorporated        Patient presents for abdominal pain.  Differential diagnosis includes appendicitis, abdominal aortic aneurysm, surgical biliary disease, pancreatitis, SBO, mesenteric ischemia, serious intra-abdominal bacterial  illness, genital torsion. Doubt atypical ACS. Based on history, physical exam, radiologic/laboratory evaluation, there is no red flag results or symptomatology requiring emergent intervention or need for admission at this time Pt tolerating PO. Disposition: Patient will be discharged with strict return precautions and follow up with primary MD within 12-24 hours for further evaluation. Patient understands that this still may have an early presentation of an emergent medical condition such as appendicitis that will require a recheck.      ____________________________________________   FINAL CLINICAL IMPRESSION(S) / ED DIAGNOSES  Final diagnoses:  Antritis (stomach)  Epigastric pain     ED Discharge Orders         Ordered    ondansetron (ZOFRAN ODT) 4 MG disintegrating tablet  Every 8 hours PRN        05/17/20 1126    sucralfate (CARAFATE) 1 g tablet  4 times daily        05/17/20 1126    famotidine (PEPCID) 20 MG tablet  2 times daily        05/17/20 1126           Note:  This document was prepared using Dragon voice recognition software and may include unintentional dictation errors.   Merwyn Katos, MD 05/17/20 1134

## 2020-07-06 ENCOUNTER — Other Ambulatory Visit: Payer: Self-pay

## 2020-07-06 ENCOUNTER — Ambulatory Visit
Admission: EM | Admit: 2020-07-06 | Discharge: 2020-07-06 | Disposition: A | Discharge status: Home / Self Care | Payer: BLUE CROSS/BLUE SHIELD | Attending: Sports Medicine | Admitting: Sports Medicine

## 2020-07-06 DIAGNOSIS — U071 COVID-19: Secondary | ICD-10-CM | POA: Insufficient documentation

## 2020-07-06 DIAGNOSIS — R509 Fever, unspecified: Secondary | ICD-10-CM | POA: Diagnosis present

## 2020-07-06 DIAGNOSIS — J069 Acute upper respiratory infection, unspecified: Secondary | ICD-10-CM

## 2020-07-06 LAB — SARS CORONAVIRUS 2 (TAT 6-24 HRS): SARS Coronavirus 2: POSITIVE — AB

## 2020-07-06 MED ORDER — BENZONATATE 100 MG PO CAPS: 200.0000 mg | ORAL_CAPSULE | Freq: Three times a day (TID) | ORAL | 0 refills | Status: AC

## 2020-07-06 MED ORDER — PROMETHAZINE-DM 6.25-15 MG/5ML PO SYRP: 5.0000 mL | ORAL_SOLUTION | Freq: Four times a day (QID) | ORAL | 0 refills | Status: AC | PRN

## 2020-07-06 NOTE — ED Triage Notes (Signed)
Patient complains of fever, cough, shortness of breath, dizziness and body aches since this morning around 1 am.

## 2020-07-06 NOTE — ED Provider Notes (Signed)
[facility]    CSN: [id number] Arrival date & time: 07/06/20  1696      History   Chief Complaint Chief Complaint  Patient presents with  . Fever    HPI Alberto Pina is a 30 y.o. male.   HPI   Patient is a 30 year old male here for evaluation of fever, cough, shortness of breath, body aches, and dizziness that started at 1 AM this morning.  Patient reports T-max of 101.5.  Patient also states that he is developing nasal congestion and a runny nose.  Patient reports that he has had some mild nausea and decreased appetite.  7 additional coworkers of his are all positive for COVID.  Patient denies sore throat, wheezing, vomiting or diarrhea, or changes to his taste or smell.  Patient has not been vaccinated against COVID or flu.  Patient does report that he has had some intermittent heart palpitations but no associated chest pain.  Past Medical History:  Diagnosis Date  . Anxiety     There are no problems to display for this patient.   Past Surgical History:  Procedure Laterality Date  . KNEE SURGERY Bilateral   . SHOULDER SURGERY Left   . WISDOM TOOTH EXTRACTION         Home Medications    Prior to Admission medications   Medication Sig Start Date End Date Taking? Authorizing Provider  benzonatate (TESSALON) 100 MG capsule Take 2 capsules (200 mg total) by mouth every 8 (eight) hours. 07/06/20  Yes Becky Augusta, NP  famotidine (PEPCID) 20 MG tablet Take 1 tablet (20 mg total) by mouth 2 (two) times daily. 05/17/20 05/17/21 Yes Bradler, Clent Jacks, MD  ondansetron (ZOFRAN ODT) 4 MG disintegrating tablet Take 1 tablet (4 mg total) by mouth every 8 (eight) hours as needed for nausea or vomiting. 05/17/20  Yes Merwyn Katos, MD  promethazine-dextromethorphan (PROMETHAZINE-DM) 6.25-15 MG/5ML syrup Take 5 mLs by mouth 4 (four) times daily as needed. 07/06/20  Yes Becky Augusta, NP  buPROPion (WELLBUTRIN XL) 300 MG 24 hr tablet Take 300 mg by mouth daily.     [provider]  clonazePAM (KLONOPIN) 0.5 MG tablet Take 0.5 mg by mouth daily.  07/06/20  [provider]  ipratropium (ATROVENT) 0.06 % nasal spray Place 2 sprays into both nostrils 4 (four) times daily as needed for rhinitis. 07/20/19 12/07/19  Tommie Sams, DO  pantoprazole (PROTONIX) 40 MG tablet Take 1 tablet (40 mg total) by mouth daily. 06/16/15 01/26/19  Minna Antis, MD  sucralfate (CARAFATE) 1 g tablet Take 1 tablet (1 g total) by mouth 4 (four) times daily. 05/17/20 07/06/20  Merwyn Katos, MD    Family History Family History  Problem Relation Age of Onset  . Hypertension Mother   . Hypertension Father   . Diabetes Father     Social History Social History   Tobacco Use  . Smoking status: Former Smoker    Packs/day: 0.25    Years: 10.00    Pack years: 2.50    Types: Cigarettes    Quit date: 12/31/2019    Years since quitting: 0.5  . Smokeless tobacco: Current User    Types: Chew  Vaping Use  . Vaping Use: Every day  . Substances: Nicotine-salt  Substance Use Topics  . Alcohol use: Not Currently  . Drug use: Yes    Frequency: 2.0 times per week    Types: Marijuana     Allergies   Tamiflu [oseltamivir phosphate]  Review of Systems Review of Systems  Constitutional: Positive for appetite change and fever. Negative for activity change.  HENT: Positive for congestion, ear pain and rhinorrhea. Negative for sore throat.   Respiratory: Positive for cough and shortness of breath. Negative for wheezing.   Cardiovascular: Positive for palpitations. Negative for chest pain.  Gastrointestinal: Positive for nausea. Negative for diarrhea and vomiting.  Musculoskeletal: Positive for arthralgias and myalgias.  Skin: Negative for rash.  Neurological: Positive for dizziness. Negative for syncope.  Hematological: Negative.   Psychiatric/Behavioral: Negative.      Physical Exam Triage Vital Signs ED Triage Vitals  Enc Vitals Group     BP  07/06/20 0928 127/87     Pulse Rate 07/06/20 0928 (!) 102     Resp 07/06/20 0928 18     Temp 07/06/20 0928 (!) 101.1 F (38.4 C)     Temp Source 07/06/20 0928 Oral     SpO2 07/06/20 0928 100 %     Weight 07/06/20 0925 170 lb (77.1 kg)     Height 07/06/20 0925 6\' 2"  (1.88 m)     Head Circumference --      Peak Flow --      Pain Score 07/06/20 0925 8     Pain Loc --      Pain Edu? --      Excl. in [place]? --    No data found.  Updated Vital Signs BP 127/87 (BP Location: Right Arm)   Pulse (!) 102   Temp (!) 101.1 F (38.4 C) (Oral)   Resp 18   Ht 6\' 2"  (1.88 m)   Wt 170 lb (77.1 kg)   SpO2 100%   BMI 21.83 kg/m   Visual Acuity Right Eye Distance:   Left Eye Distance:   Bilateral Distance:    Right Eye Near:   Left Eye Near:    Bilateral Near:     Physical Exam Vitals and nursing note reviewed.  Constitutional:      General: He is not in acute distress.    Appearance: Normal appearance. He is normal weight. He is not toxic-appearing.  HENT:     Head: Normocephalic and atraumatic.     Right Ear: Tympanic membrane, ear canal and external ear normal.     Left Ear: Tympanic membrane, ear canal and external ear normal.     Nose: Congestion and rhinorrhea present.     Comments: Nasal mucosa is erythematous and mildly edematous with clear nasal discharge.    Mouth/Throat:     Mouth: Mucous membranes are moist.     Pharynx: Oropharynx is clear. Posterior oropharyngeal erythema present.     Comments: Tonsillar pillars are unremarkable.  Posterior oropharynx has mild erythema and clear postnasal drip. Cardiovascular:     Rate and Rhythm: Normal rate and regular rhythm.     Pulses: Normal pulses.     Heart sounds: Normal heart sounds. No murmur heard. No gallop.   Pulmonary:     Effort: Pulmonary effort is normal.     Breath sounds: Normal breath sounds. No wheezing, rhonchi or rales.  Musculoskeletal:     Cervical back: Normal range of motion and neck supple.   Lymphadenopathy:     Cervical: No cervical adenopathy.  Skin:    General: Skin is warm and dry.     Capillary Refill: Capillary refill takes less than 2 seconds.     Findings: No erythema or rash.  Neurological:     General: No focal deficit  present.     Mental Status: He is alert and oriented to person, place, and time.  Psychiatric:        Mood and Affect: Mood normal.        Behavior: Behavior normal.        Thought Content: Thought content normal.        Judgment: Judgment normal.      UC Treatments / Results  Labs (all labs ordered are listed, but only abnormal results are displayed) Labs Reviewed  SARS CORONAVIRUS 2 (TAT 6-24 HRS)    EKG   Radiology No results found.  Procedures Procedures (including critical care time)  Medications Ordered in UC Medications - No data to display  Initial Impression / Assessment and Plan / UC Course  I have reviewed the triage vital signs and the nursing notes.  Pertinent labs & imaging results that were available during my care of the patient were reviewed by me and considered in my medical decision making (see chart for details).   Patient is a very pleasant 30 year old male here for evaluation of COVID symptoms that developed this morning.  Patient currently has 7 of his coworkers who are COVID positive.  Patient is nontoxic in appearance and he presents for evaluation and testing.  Patient does report that he has had some intermittent heart palpitations that occur sometimes with movement or if he takes a deep breath.  Patient denies any syncope or chest pain.  Patient is concerned because his father had some sort of genetic anomaly and recently had to have a valve replacement and multivessel bypass graft of his heart.  Patient has not yet been evaluated by cardiology.  Patient's lung sounds are clear to auscultation all fields and his heart sounds are S1-S2 without ectopy, rub, or murmur.  Will swab patient for COVID and  discharged home with Tessalon Perles and Promethazine DM cough syrup to help with cough and congestion.  Patient advised that if he develops a continuous run of palpitations, dizziness, chest pain with or without associated shortness of breath, radiation, or nausea he needs to go the ER for evaluation.   Final Clinical Impressions(s) / UC Diagnoses   Final diagnoses:  Upper respiratory tract infection, unspecified type     Discharge Instructions     Isolate at home until the results of your COVID test are back.  If you are positive then you will need to quarantine for 5 days from when your symptoms started.  After the 5 days you can break quarantine if your symptoms have improved and you have not had a fever for 24 hours without taking Tylenol or ibuprofen.  Use over-the-counter Tylenol and ibuprofen as needed for pain and fever.  Use the Tessalon Perles during the day as needed for cough and the Promethazine DM cough syrup at bedtime as needed for cough and congestion.  If you develop an increase in your heart palpitations, especially if they come on and stay, they are associated with dizziness, chest pain, increase shortness of breath, or radiation to your jaw, left shoulder, or back then you need to go to the ER for evaluation.    ED Prescriptions    Medication Sig Dispense Auth. Provider   benzonatate (TESSALON) 100 MG capsule Take 2 capsules (200 mg total) by mouth every 8 (eight) hours. 21 capsule Becky Augusta, NP   promethazine-dextromethorphan (PROMETHAZINE-DM) 6.25-15 MG/5ML syrup Take 5 mLs by mouth 4 (four) times daily as needed. 118 mL Becky Augusta, NP  PDMP not reviewed this encounter.   Becky Augustayan, Synethia Endicott, NP 07/06/20 1010

## 2020-07-06 NOTE — Discharge Instructions (Addendum)
Isolate at home until the results of your COVID test are back.  If you are positive then you will need to quarantine for 5 days from when your symptoms started.  After the 5 days you can break quarantine if your symptoms have improved and you have not had a fever for 24 hours without taking Tylenol or ibuprofen.  Use over-the-counter Tylenol and ibuprofen as needed for pain and fever.  Use the Tessalon Perles during the day as needed for cough and the Promethazine DM cough syrup at bedtime as needed for cough and congestion.  If you develop an increase in your heart palpitations, especially if they come on and stay, they are associated with dizziness, chest pain, increase shortness of breath, or radiation to your jaw, left shoulder, or back then you need to go to the ER for evaluation.

## 2021-04-21 ENCOUNTER — Ambulatory Visit (INDEPENDENT_AMBULATORY_CARE_PROVIDER_SITE_OTHER): Payer: BLUE CROSS/BLUE SHIELD

## 2021-04-21 ENCOUNTER — Other Ambulatory Visit: Payer: Self-pay

## 2021-04-21 ENCOUNTER — Ambulatory Visit
Admission: EM | Admit: 2021-04-21 | Discharge: 2021-04-21 | Disposition: A | Discharge status: Home / Self Care | Payer: BLUE CROSS/BLUE SHIELD | Attending: Medical Oncology | Admitting: Medical Oncology

## 2021-04-21 ENCOUNTER — Encounter: Payer: Self-pay | Admitting: Emergency Medicine

## 2021-04-21 DIAGNOSIS — L03012 Cellulitis of left finger: Secondary | ICD-10-CM

## 2021-04-21 DIAGNOSIS — S61012A Laceration without foreign body of left thumb without damage to nail, initial encounter: Secondary | ICD-10-CM | POA: Diagnosis not present

## 2021-04-21 LAB — CBC WITH DIFFERENTIAL/PLATELET
Abs Immature Granulocytes: 0.06 10*3/uL (ref 0.00–0.07)
Basophils Absolute: 0.1 10*3/uL (ref 0.0–0.1)
Basophils Relative: 0 %
Eosinophils Absolute: 0.1 10*3/uL (ref 0.0–0.5)
Eosinophils Relative: 0 %
HCT: 45.9 % (ref 39.0–52.0)
Hemoglobin: 15.8 g/dL (ref 13.0–17.0)
Immature Granulocytes: 0 %
Lymphocytes Relative: 12 %
Lymphs Abs: 1.9 10*3/uL (ref 0.7–4.0)
MCH: 31.3 pg (ref 26.0–34.0)
MCHC: 34.4 g/dL (ref 30.0–36.0)
MCV: 90.9 fL (ref 80.0–100.0)
Monocytes Absolute: 1.1 10*3/uL — ABNORMAL HIGH (ref 0.1–1.0)
Monocytes Relative: 7 %
Neutro Abs: 12.6 10*3/uL — ABNORMAL HIGH (ref 1.7–7.7)
Neutrophils Relative %: 81 %
Platelets: 319 10*3/uL (ref 150–400)
RBC: 5.05 MIL/uL (ref 4.22–5.81)
RDW: 11.9 % (ref 11.5–15.5)
WBC: 15.7 10*3/uL — ABNORMAL HIGH (ref 4.0–10.5)
nRBC: 0 % (ref 0.0–0.2)

## 2021-04-21 NOTE — Discharge Instructions (Addendum)
Please go directly to the emergency room 

## 2021-04-21 NOTE — ED Triage Notes (Signed)
Patient states that he cut his left thumb with a grinder on Tuesday at his home.  Patient states that he was not seen for this injury.  Patient states that the area has become red, swelling and painful.

## 2021-04-21 NOTE — ED Provider Notes (Signed)
[facility]    CSN: [id number] Arrival date & time: 04/21/21  1149      History   Chief Complaint Chief Complaint  Patient presents with   Extremity Laceration    Left thumb    HPI Brett [name] is a 30 y.o. male.   HPI  Extremity laceration: Patient reports that on Tuesday he accidentally hit his left thumb with a grinder as he was grinding wood and metal.  He shows a picture of the laceration which appears to be about 2 mm deep and did not appear to hit the tendon of his thumb.  Bleeding was able to be contained fairly well.  He reports that he dressed this with a Band-Aid and was doing well until yesterday when he noticed some swelling and pain at the site along with redness.  This has worsened.  He now is having some difficulty with range of motion of the thumb due to swelling.  He denies any history of MRSA any fever or any vomiting any flulike symptoms. He reports that he is UTD on his Tdap.   Past Medical History:  Diagnosis Date   Anxiety     There are no problems to display for this patient.   Past Surgical History:  Procedure Laterality Date   KNEE SURGERY Bilateral    SHOULDER SURGERY Left    WISDOM TOOTH EXTRACTION         Home Medications    Prior to Admission medications   Medication Sig Start Date End Date Taking? Authorizing Provider  losartan (COZAAR) 50 MG tablet Take 50 mg by mouth daily. 03/12/21  Yes [provider]  sertraline (ZOLOFT) 50 MG tablet Take 50 mg by mouth daily. 04/18/21  Yes [provider]  benzonatate (TESSALON) 100 MG capsule Take 2 capsules (200 mg total) by mouth every 8 (eight) hours. 07/06/20   Becky Augusta, NP  buPROPion (WELLBUTRIN XL) 300 MG 24 hr tablet Take 300 mg by mouth daily.    [provider]  famotidine (PEPCID) 20 MG tablet Take 1 tablet (20 mg total) by mouth 2 (two) times daily. 05/17/20 05/17/21  Merwyn Katos, MD  ondansetron (ZOFRAN ODT) 4 MG disintegrating tablet  Take 1 tablet (4 mg total) by mouth every 8 (eight) hours as needed for nausea or vomiting. 05/17/20   Merwyn Katos, MD  promethazine-dextromethorphan (PROMETHAZINE-DM) 6.25-15 MG/5ML syrup Take 5 mLs by mouth 4 (four) times daily as needed. 07/06/20   Becky Augusta, NP  clonazePAM (KLONOPIN) 0.5 MG tablet Take 0.5 mg by mouth daily.  07/06/20  [provider]  ipratropium (ATROVENT) 0.06 % nasal spray Place 2 sprays into both nostrils 4 (four) times daily as needed for rhinitis. 07/20/19 12/07/19  Tommie Sams, DO  pantoprazole (PROTONIX) 40 MG tablet Take 1 tablet (40 mg total) by mouth daily. 06/16/15 01/26/19  Minna Antis, MD  sucralfate (CARAFATE) 1 g tablet Take 1 tablet (1 g total) by mouth 4 (four) times daily. 05/17/20 07/06/20  Merwyn Katos, MD    Family History Family History  Problem Relation Age of Onset   Hypertension Mother    Hypertension Father    Diabetes Father     Social History Social History   Tobacco Use   Smoking status: Every Day    Packs/day: 0.25    Years: 10.00    Pack years: 2.50    Types: Cigarettes    Last attempt to quit: 12/31/2019    Years since  quitting: 1.3   Smokeless tobacco: Current    Types: Chew  Vaping Use   Vaping Use: Former   Substances: Nicotine-salt  Substance Use Topics   Alcohol use: Not Currently   Drug use: Yes    Frequency: 2.0 times per week    Types: Marijuana     Allergies   Tamiflu [oseltamivir phosphate]   Review of Systems Review of Systems  As stated above in HPI Physical Exam Triage Vital Signs ED Triage Vitals  Enc Vitals Group     BP 04/21/21 1227 (!) 142/96     Pulse Rate 04/21/21 1227 (!) 102     Resp 04/21/21 1227 16     Temp 04/21/21 1227 98.2 F (36.8 C)     Temp Source 04/21/21 1227 Oral     SpO2 04/21/21 1227 100 %     Weight 04/21/21 1221 165 lb (74.8 kg)     Height 04/21/21 1221 6' (1.829 m)     Head Circumference --      Peak Flow --      Pain Score 04/21/21 1220 8      Pain Loc --      Pain Edu? --      Excl. in [place]? --    No data found.  Updated Vital Signs BP (!) 142/96 (BP Location: Right Arm)   Pulse (!) 102   Temp 98.2 F (36.8 C) (Oral)   Resp 16   Ht 6' (1.829 m)   Wt 165 lb (74.8 kg)   SpO2 100%   BMI 22.38 kg/m   Physical Exam Vitals and nursing note reviewed.  Constitutional:      General: He is not in acute distress.    Appearance: Normal appearance. He is not ill-appearing, toxic-appearing or diaphoretic.  Cardiovascular:     Rate and Rhythm: Normal rate and regular rhythm.     Pulses: Normal pulses.     Heart sounds: Normal heart sounds.  Pulmonary:     Effort: Pulmonary effort is normal.     Breath sounds: Normal breath sounds.  Musculoskeletal:        General: Swelling and tenderness (mild tenderness of the left thumb) present.     Comments: ROM of left thumb slightly reduced due to extent of swelling.   Skin:    General: Skin is warm.     Capillary Refill: Capillary refill takes less than 2 seconds.     Comments: 5 mm long healing laceration of the left proximal thumb.   Neurological:     Mental Status: He is alert and oriented to person, place, and time.              UC Treatments / Results  Labs (all labs ordered are listed, but only abnormal results are displayed) Labs Reviewed - No data to display  EKG   Radiology No results found.  Procedures Procedures (including critical care time)  Medications Ordered in UC Medications - No data to display  Initial Impression / Assessment and Plan / UC Course  I have reviewed the triage vital signs and the nursing notes.  Pertinent labs & imaging results that were available during my care of the patient were reviewed by me and considered in my medical decision making (see chart for details).     New.  Concern for osteo vs inflection of his tendon sheath vs vasculitis vs cellulitis given his fairly extensive cellulitis. X ray does not show evidence of  Osteo however given  his significantly elevated white blood cell count, presentation and range of motion difficulty I am recommending that he go to the emergency room as he does appear to need IV antibiotics and potentially will need surgical consult which I discussed with patient to prevent further sepsis and significant complication.  He is agreeable and elects to drive himself to the hospital.  Final Clinical Impressions(s) / UC Diagnoses   Final diagnoses:  None   Discharge Instructions   None    ED Prescriptions   None    PDMP not reviewed this encounter.   Hughie Closs, Hershal Coria 04/21/21 1341

## 2022-11-21 ENCOUNTER — Inpatient Hospital Stay
Admission: RE | Admit: 2022-11-21 | Discharge: 2022-11-21 | Disposition: A | Discharge status: Home / Self Care | Payer: BLUE CROSS/BLUE SHIELD | Source: Ambulatory Visit

## 2022-12-17 ENCOUNTER — Emergency Department
Admission: EM | Admit: 2022-12-17 | Discharge: 2022-12-18 | Disposition: A | Discharge status: Home / Self Care | Payer: BLUE CROSS/BLUE SHIELD | Attending: Emergency Medicine | Admitting: Emergency Medicine

## 2022-12-17 DIAGNOSIS — F1910 Other psychoactive substance abuse, uncomplicated: Secondary | ICD-10-CM | POA: Diagnosis not present

## 2022-12-17 DIAGNOSIS — Y906 Blood alcohol level of 120-199 mg/100 ml: Secondary | ICD-10-CM | POA: Insufficient documentation

## 2022-12-17 DIAGNOSIS — R748 Abnormal levels of other serum enzymes: Secondary | ICD-10-CM | POA: Diagnosis not present

## 2022-12-17 DIAGNOSIS — F10129 Alcohol abuse with intoxication, unspecified: Secondary | ICD-10-CM | POA: Insufficient documentation

## 2022-12-17 DIAGNOSIS — X31XXXA Exposure to excessive natural cold, initial encounter: Secondary | ICD-10-CM | POA: Insufficient documentation

## 2022-12-17 DIAGNOSIS — T68XXXA Hypothermia, initial encounter: Secondary | ICD-10-CM | POA: Insufficient documentation

## 2022-12-17 DIAGNOSIS — F10929 Alcohol use, unspecified with intoxication, unspecified: Secondary | ICD-10-CM

## 2022-12-17 MED ORDER — DROPERIDOL 2.5 MG/ML IJ SOLN
2.5000 mg | Freq: Once | INTRAMUSCULAR | Status: AC
  Administered 2022-12-18: 2.5 mg via INTRAVENOUS
  Filled 2022-12-17: qty 2

## 2022-12-17 MED ORDER — LACTATED RINGERS IV BOLUS
1000.0000 mL | Freq: Once | INTRAVENOUS | Status: AC
  Administered 2022-12-18: 1000 mL via INTRAVENOUS

## 2022-12-17 MED ORDER — DIPHENHYDRAMINE HCL 50 MG/ML IJ SOLN
12.5000 mg | INTRAMUSCULAR | Status: AC
  Administered 2022-12-18: 12.5 mg via INTRAVENOUS
  Filled 2022-12-17: qty 1

## 2022-12-18 ENCOUNTER — Other Ambulatory Visit: Payer: Self-pay

## 2022-12-18 LAB — ETHANOL: Alcohol, Ethyl (B): 122 mg/dL — ABNORMAL HIGH (ref <10)

## 2022-12-18 LAB — COMPREHENSIVE METABOLIC PANEL
ALT: 26 U/L (ref 0–44)
AST: 31 U/L (ref 15–41)
Albumin: 5 g/dL (ref 3.5–5.0)
Alkaline Phosphatase: 73 U/L (ref 38–126)
Anion gap: 19 — ABNORMAL HIGH (ref 5–15)
BUN: 13 mg/dL (ref 6–20)
CO2: 15 mmol/L — ABNORMAL LOW (ref 22–32)
Calcium: 9.1 mg/dL (ref 8.9–10.3)
Chloride: 104 mmol/L (ref 98–111)
Creatinine, Ser: 1.01 mg/dL (ref 0.61–1.24)
GFR, Estimated: >60 mL/min (ref >60)
Glucose, Bld: 110 mg/dL — ABNORMAL HIGH (ref 70–99)
Potassium: 2.9 mmol/L — ABNORMAL LOW (ref 3.5–5.1)
Sodium: 138 mmol/L (ref 135–145)
Total Bilirubin: 0.5 mg/dL (ref 0.3–1.2)
Total Protein: 7.9 g/dL (ref 6.5–8.1)

## 2022-12-18 LAB — LIPASE, BLOOD: Lipase: 94 U/L — ABNORMAL HIGH (ref 11–51)

## 2022-12-18 LAB — CBC WITH DIFFERENTIAL/PLATELET
Abs Immature Granulocytes: 0.14 10*3/uL — ABNORMAL HIGH (ref 0.00–0.07)
Basophils Absolute: 0.1 10*3/uL (ref 0.0–0.1)
Basophils Relative: 1 %
Eosinophils Absolute: 0.1 10*3/uL (ref 0.0–0.5)
Eosinophils Relative: 1 %
HCT: 46.8 % (ref 39.0–52.0)
Hemoglobin: 16 g/dL (ref 13.0–17.0)
Immature Granulocytes: 1 %
Lymphocytes Relative: 18 %
Lymphs Abs: 2.7 10*3/uL (ref 0.7–4.0)
MCH: 30.9 pg (ref 26.0–34.0)
MCHC: 34.2 g/dL (ref 30.0–36.0)
MCV: 90.3 fL (ref 80.0–100.0)
Monocytes Absolute: 0.6 10*3/uL (ref 0.1–1.0)
Monocytes Relative: 4 %
Neutro Abs: 11.4 10*3/uL — ABNORMAL HIGH (ref 1.7–7.7)
Neutrophils Relative %: 75 %
Platelets: 305 10*3/uL (ref 150–400)
RBC: 5.18 MIL/uL (ref 4.22–5.81)
RDW: 11.4 % — ABNORMAL LOW (ref 11.5–15.5)
WBC: 15 10*3/uL — ABNORMAL HIGH (ref 4.0–10.5)
nRBC: 0 % (ref 0.0–0.2)

## 2022-12-18 LAB — MAGNESIUM: Magnesium: 2.4 mg/dL (ref 1.7–2.4)

## 2022-12-18 MED ORDER — POTASSIUM CHLORIDE 10 MEQ/100ML IV SOLN
10.0000 meq | Freq: Once | INTRAVENOUS | Status: AC
  Administered 2022-12-18: 10 meq via INTRAVENOUS
  Filled 2022-12-18: qty 100

## 2022-12-18 MED ORDER — ONDANSETRON 4 MG PO TBDP: ORAL_TABLET | ORAL | 0 refills | Status: AC

## 2022-12-18 MED ORDER — POTASSIUM CHLORIDE CRYS ER 20 MEQ PO TBCR
40.0000 meq | EXTENDED_RELEASE_TABLET | Freq: Once | ORAL | Status: AC
  Administered 2022-12-18: 40 meq via ORAL
  Filled 2022-12-18: qty 2

## 2022-12-18 MED ORDER — LACTATED RINGERS IV BOLUS
1000.0000 mL | Freq: Once | INTRAVENOUS | Status: AC
  Administered 2022-12-18: 1000 mL via INTRAVENOUS

## 2022-12-18 NOTE — ED Provider Notes (Signed)
[facility] Provider Note    Event Date/Time   First MD Initiated Contact with Patient 12/17/22 2338     (approximate)   History   Abdominal Pain (Pt coming from home d/t abd pain. Pt was drinking rum and smoking delta 8 tonight at home. Pt spouse sts he started shaking uncontrollable. Pt spouse sts he has anxiety and hx of gastritis. )  Level 5 caveat:  history/ROS limited by acute/critical illness  HPI Brett [name] is a 32 y.o. male with history of alcohol abuse and reportedly chronic pancreatitis who presents by EMS for evaluation of abdominal pain and what may be an anxiety or panic attack as well as nausea and vomiting.  He says he has been drinking alcohol all day, and when this happens, it leads to his pancreas hurting.  He also said that he has been taking delta 8 Gummies all day.  He claims that this is because his grandfather died recently.  He is heaving, spitting, crying, and moaning upon arrival and is unable to give additional history.     Physical Exam   ED Triage Vitals  Enc Vitals Group     BP 12/18/22 0029 110/79     Pulse Rate 12/18/22 0029 (!) 53     Resp 12/18/22 0005 (!) 26     Temp 12/18/22 0005 (!) 92 F (33.3 C)     Temp Source 12/18/22 0005 Rectal     SpO2 12/18/22 0005 100 %     Weight --      Height --      Head Circumference --      Peak Flow --      Pain Score 12/18/22 0030 10     Pain Loc --      Pain Edu? --      Excl. in GC? --      Most recent vital signs: Vitals:   12/18/22 0250 12/18/22 0253  BP: (!) 105/56   Pulse: 86   Resp: 16   Temp:  (!) 97.5 F (36.4 C)  SpO2: 97%     General: Awake, alert, ill-appearing but nontoxic. CV:  Good peripheral perfusion.  Regular rate and rhythm. Resp:  Tachypnea but with normal effort.  Lungs clear to auscultation bilaterally. Abd:  No distention.  Mild generalized tenderness to palpation throughout the abdomen, no guarding, no epigastric or right upper quadrant  tenderness.  Nonperitoneal exam. Other:  Patient is pale, clammy all over, shaking and shivering, clearly intoxicated and seems to also be suffering from side effects from other substances (likely the delta 8 Gummies he reports taking).   ED Results / Procedures / Treatments   Labs (all labs ordered are listed, but only abnormal results are displayed) Labs Reviewed  ETHANOL - Abnormal; Notable for the following components:      Result Value   Alcohol, Ethyl (B) 122 (*)    All other components within normal limits  COMPREHENSIVE METABOLIC PANEL - Abnormal; Notable for the following components:   Potassium 2.9 (*)    CO2 15 (*)    Glucose, Bld 110 (*)    Anion gap 19 (*)    All other components within normal limits  LIPASE, BLOOD - Abnormal; Notable for the following components:   Lipase 94 (*)    All other components within normal limits  CBC WITH DIFFERENTIAL/PLATELET - Abnormal; Notable for the following components:   WBC 15.0 (*)    RDW 11.4 (*)  Neutro Abs 11.4 (*)    Abs Immature Granulocytes 0.14 (*)    All other components within normal limits  MAGNESIUM  URINALYSIS, ROUTINE W REFLEX MICROSCOPIC  URINE DRUG SCREEN, QUALITATIVE ([facility] ONLY)       PROCEDURES:  Critical Care performed: No  .1-3 Lead EKG Interpretation  Performed by: Loleta Rose, MD Authorized by: Loleta Rose, MD     Interpretation: normal     ECG rate:  67   ECG rate assessment: normal     Rhythm: sinus rhythm     Ectopy: none     Conduction: normal       IMPRESSION / MDM / ASSESSMENT AND PLAN / ED COURSE  I reviewed the triage vital signs and the nursing notes.                              Differential diagnosis includes, but is not limited to, drug and alcohol abuse, exposure, electrolyte or metabolic abnormality, acute infection, possible unknown or occult trauma, pancreatitis.  Patient's presentation is most consistent with acute presentation with potential threat to life or  bodily function.  Labs/studies ordered: CBC with differential, CMP, lipase, ethanol, magnesium, urinalysis, urine drug screen  Interventions/Medications given:  Medications  droperidol (INAPSINE) 2.5 MG/ML injection 2.5 mg (2.5 mg Intravenous Given 12/18/22 0013)  diphenhydrAMINE (BENADRYL) injection 12.5 mg (12.5 mg Intravenous Given 12/18/22 0014)  lactated ringers bolus 1,000 mL (1,000 mLs Intravenous New Bag/Given 12/18/22 0022)  potassium chloride 10 mEq in 100 mL IVPB (10 mEq Intravenous New Bag/Given 12/18/22 0246)  potassium chloride SA (KLOR-CON M) CR tablet 40 mEq (40 mEq Oral Given 12/18/22 0247)  lactated ringers bolus 1,000 mL (1,000 mLs Intravenous New Bag/Given 12/18/22 0235)    (Note:  hospital course my include additional interventions and/or labs/studies not listed above.)   Suspect symptoms are mostly result of drug and alcohol use, but infection is still possible though the patient has no other symptoms that he can articulate.  Rectal temperature was 92 degrees and staff started him on a Lawyer.  I anticipate he may develop some hypotension with vasodilation but I am also providing 2 L IV fluids.  The patient seems to be having a reaction to the alcohol and the delta 8 Gummies as well as having nausea and vomiting so I ordered 2.5 mg of droperidol to help as a calming agent and antiemetic in addition to his fluids.    The patient is on the cardiac monitor to evaluate for evidence of arrhythmia and/or significant heart rate changes.   Clinical Course as of 12/18/22 0426  Tue Dec 18, 2022  1610 Patient is completely back to normal, alert and oriented, no distress.  He said he feels so much better.  He does not remember meeting me earlier but he confirmed the story that he told me previously.  It is unclear why he was so hypothermic, but it apparently is due to the substances.  He is now normothermic, alert, oriented, no pain, no distress, no nausea and vomiting.  The patient is  eager to leave and at this point he has no clinical signs of intoxication and has capacity make his own decisions.  I think it is reasonable to discharge him given no ongoing signs of infection/sepsis or other emergent condition.  I gave strict return precautions and also gave him outpatient resources for his substance abuse issues.  He and his girlfriend, who called  911 from earlier, agree with the plan and are ready for discharge. [CF]    Clinical Course User Index [CF] Loleta Rose, MD     FINAL CLINICAL IMPRESSION(S) / ED DIAGNOSES   Final diagnoses:  Alcoholic intoxication with complication (HCC)  Hypothermia, initial encounter  Polysubstance abuse (HCC)  Elevated lipase     Rx / DC Orders   ED Discharge Orders          Ordered    ondansetron (ZOFRAN-ODT) 4 MG disintegrating tablet        12/18/22 0425             Note:  This document was prepared using Dragon voice recognition software and may include unintentional dictation errors.   Loleta Rose, MD 12/18/22 [phone]

## 2022-12-18 NOTE — Discharge Instructions (Signed)
As we discussed, we believe that the issues she had tonight are due to binge drinking and to the use of the delta 8 Gummies.  Please consider following up with the resources provided for help with your ongoing substance abuse issue.  Drink plenty of fluids and try to decrease the amount of alcohol you drink.  Take your regular medications as previously prescribed

## 2022-12-18 NOTE — ED Provider Notes (Incomplete)
   [facility] Provider Note    Event Date/Time   First MD Initiated Contact with Patient 12/17/22 2338     (approximate)   History   No chief complaint on file.  Level 5 caveat:  history/ROS limited by acute/critical illness  HPI Brett [name] is a 32 y.o. male with history of alcohol abuse and reportedly chronic pancreatitis who presents by EMS for evaluation of abdominal pain and what may be an anxiety or panic attack as well as nausea and vomiting.  He says he has been drinking alcohol all day, and when this happens, it leads to his pancreas hurting.  He also said that he has been taking delta 8 Gummies all day.  He claims that this is because his grandfather died recently.  He is heaving, spitting, crying, and moaning upon arrival and is unable to give additional history.     Physical Exam   ***  Most recent vital signs: There were no vitals filed for this visit.  General: Awake, alert, ill-appearing but nontoxic. CV:  Good peripheral perfusion.  Regular rate and rhythm. Resp:  Tachypnea but with normal effort.  Lungs clear to auscultation bilaterally. Abd:  No distention.  Mild generalized tenderness to palpation throughout the abdomen, no guarding, no epigastric or right upper quadrant tenderness.  Nonperitoneal exam. Other:  Patient is pale, clammy all over, shaking and shivering, clearly intoxicated and seems to also be suffering from side effects from other substances (likely the delta 8 Gummies he reports taking).   ED Results / Procedures / Treatments   Labs (all labs ordered are listed, but only abnormal results are displayed) Labs Reviewed  ETHANOL  COMPREHENSIVE METABOLIC PANEL  LIPASE, BLOOD  CBC WITH DIFFERENTIAL/PLATELET  URINE DRUG SCREEN, QUALITATIVE ([facility] ONLY)  MAGNESIUM     EKG  ***   RADIOLOGY ***   PROCEDURES:  Critical Care performed: {CriticalCareYesNo:19197::"Yes, see critical care procedure  note(s)","No"}  Procedures    IMPRESSION / MDM / ASSESSMENT AND PLAN / ED COURSE  I reviewed the triage vital signs and the nursing notes.                              Differential diagnosis includes, but is not limited to, ***  Patient's presentation is most consistent with {EM COPA:27473}  Labs/studies ordered: ***  Interventions/Medications given:  Medications  droperidol (INAPSINE) 2.5 MG/ML injection 2.5 mg (has no administration in time range)  diphenhydrAMINE (BENADRYL) injection 12.5 mg (has no administration in time range)  lactated ringers bolus 1,000 mL (has no administration in time range)   *** (Note:  hospital course my include additional interventions and/or labs/studies not listed above.)   ***  {**The patient is on the cardiac monitor to evaluate for evidence of arrhythmia and/or significant heart rate changes.**}       FINAL CLINICAL IMPRESSION(S) / ED DIAGNOSES   Final diagnoses:  None     Rx / DC Orders   ED Discharge Orders     None        Note:  This document was prepared using Dragon voice recognition software and may include unintentional dictation errors.

## 2022-12-18 NOTE — ED Triage Notes (Signed)
Pt coming from home d/t abd pain. Pt was drinking rum and smoking delta 8 tonight at home. Pt spouse sts he started shaking uncontrollable. Pt spouse sts he has anxiety and hx of gastritis.

## 2023-06-04 ENCOUNTER — Telehealth: Payer: Self-pay | Admitting: Medical Oncology

## 2023-06-04 ENCOUNTER — Emergency Department
Admission: EM | Admit: 2023-06-04 | Discharge: 2023-06-04 | Discharge status: Against Medical Advice | Payer: BLUE CROSS/BLUE SHIELD | Attending: Emergency Medicine | Admitting: Emergency Medicine

## 2023-06-04 ENCOUNTER — Encounter: Payer: Self-pay | Admitting: Emergency Medicine

## 2023-06-04 ENCOUNTER — Other Ambulatory Visit: Payer: Self-pay

## 2023-06-04 DIAGNOSIS — R509 Fever, unspecified: Secondary | ICD-10-CM | POA: Diagnosis present

## 2023-06-04 DIAGNOSIS — R112 Nausea with vomiting, unspecified: Secondary | ICD-10-CM | POA: Diagnosis not present

## 2023-06-04 DIAGNOSIS — R1010 Upper abdominal pain, unspecified: Secondary | ICD-10-CM | POA: Insufficient documentation

## 2023-06-04 DIAGNOSIS — Z5321 Procedure and treatment not carried out due to patient leaving prior to being seen by health care provider: Secondary | ICD-10-CM | POA: Diagnosis not present

## 2023-06-04 DIAGNOSIS — Z20822 Contact with and (suspected) exposure to covid-19: Secondary | ICD-10-CM | POA: Diagnosis not present

## 2023-06-04 LAB — COMPREHENSIVE METABOLIC PANEL
ALT: 26 U/L (ref 0–44)
AST: 33 U/L (ref 15–41)
Albumin: 5.1 g/dL — ABNORMAL HIGH (ref 3.5–5.0)
Alkaline Phosphatase: 73 U/L (ref 38–126)
Anion gap: 14 (ref 5–15)
BUN: 14 mg/dL (ref 6–20)
CO2: 18 mmol/L — ABNORMAL LOW (ref 22–32)
Calcium: 9.7 mg/dL (ref 8.9–10.3)
Chloride: 106 mmol/L (ref 98–111)
Creatinine, Ser: 0.85 mg/dL (ref 0.61–1.24)
GFR, Estimated: >60 mL/min (ref >60)
Glucose, Bld: 140 mg/dL — ABNORMAL HIGH (ref 70–99)
Potassium: 3.3 mmol/L — ABNORMAL LOW (ref 3.5–5.1)
Sodium: 138 mmol/L (ref 135–145)
Total Bilirubin: 1.5 mg/dL — ABNORMAL HIGH (ref <1.2)
Total Protein: 7.8 g/dL (ref 6.5–8.1)

## 2023-06-04 LAB — CBC WITH DIFFERENTIAL/PLATELET
Abs Immature Granulocytes: 0.69 10*3/uL — ABNORMAL HIGH (ref 0.00–0.07)
Basophils Absolute: 0 10*3/uL (ref 0.0–0.1)
Basophils Relative: 0 %
Eosinophils Absolute: 0 10*3/uL (ref 0.0–0.5)
Eosinophils Relative: 0 %
HCT: 45.3 % (ref 39.0–52.0)
Hemoglobin: 16 g/dL (ref 13.0–17.0)
Immature Granulocytes: 2 %
Lymphocytes Relative: 2 %
Lymphs Abs: 0.8 10*3/uL (ref 0.7–4.0)
MCH: 31.3 pg (ref 26.0–34.0)
MCHC: 35.3 g/dL (ref 30.0–36.0)
MCV: 88.5 fL (ref 80.0–100.0)
Monocytes Absolute: 1.1 10*3/uL — ABNORMAL HIGH (ref 0.1–1.0)
Monocytes Relative: 3 %
Neutro Abs: 34.1 10*3/uL — ABNORMAL HIGH (ref 1.7–7.7)
Neutrophils Relative %: 93 %
Platelets: 330 10*3/uL (ref 150–400)
RBC: 5.12 MIL/uL (ref 4.22–5.81)
RDW: 11.6 % (ref 11.5–15.5)
Smear Review: NORMAL
WBC: 36.7 10*3/uL — ABNORMAL HIGH (ref 4.0–10.5)
nRBC: 0 % (ref 0.0–0.2)

## 2023-06-04 LAB — SARS CORONAVIRUS 2 BY RT PCR: SARS Coronavirus 2 by RT PCR: NEGATIVE

## 2023-06-04 LAB — LIPASE, BLOOD: Lipase: 28 U/L (ref 11–51)

## 2023-06-04 NOTE — ED Triage Notes (Addendum)
Pt to triage via w/c with no distress noted; reports fever, chills, N/V, upper abd pain tonight

## 2023-06-04 NOTE — Telephone Encounter (Signed)
Attempted without succes to call patient regarding his elevated WBC count. VM left for patient to return call. MD Derrill Kay advised that pt should have f/u care.

## 2023-07-03 IMAGING — CR DG FINGER THUMB 2+V*L*
3 series · 3 of 3 positions shown · non-contrast
Comparison: None.

CLINICAL DATA: Rule out osteo.  Laceration.

EXAM:
LEFT THUMB 2+V

[finger ap]
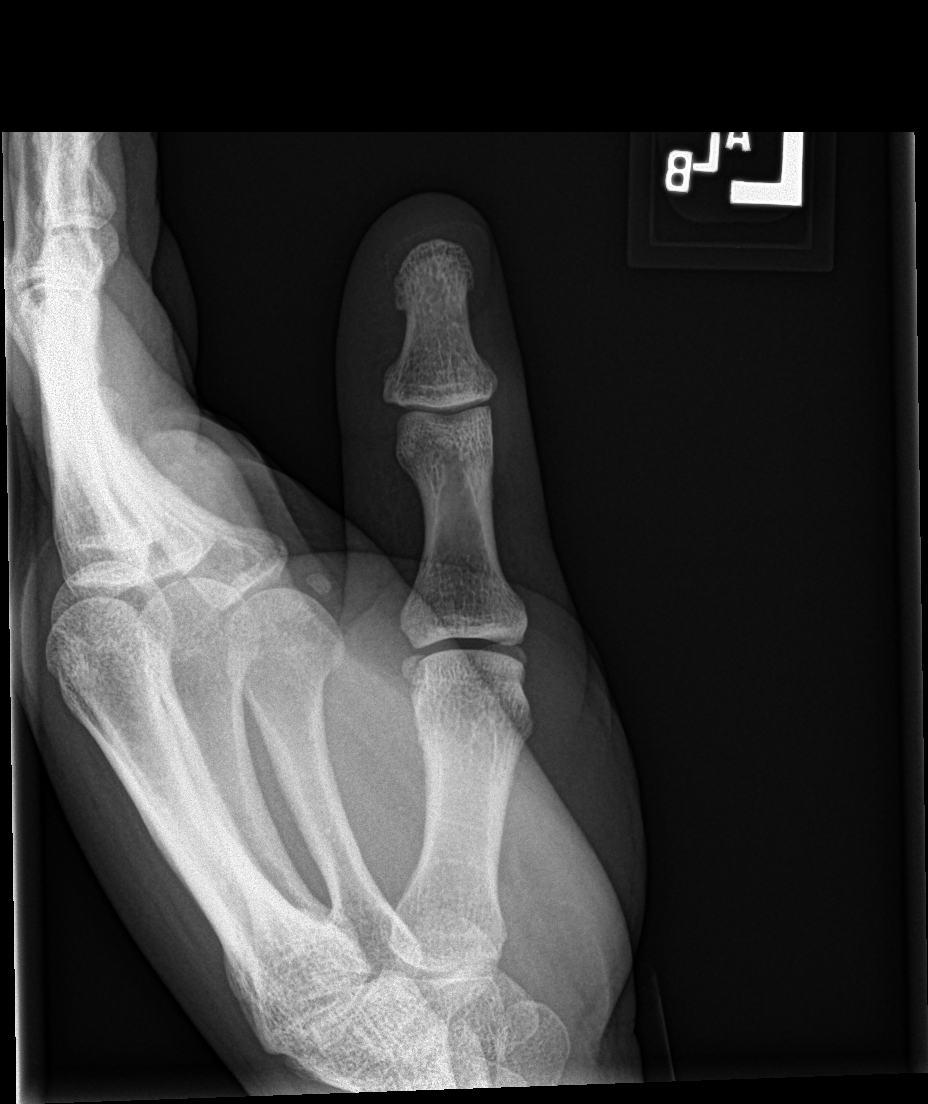

[finger obl]
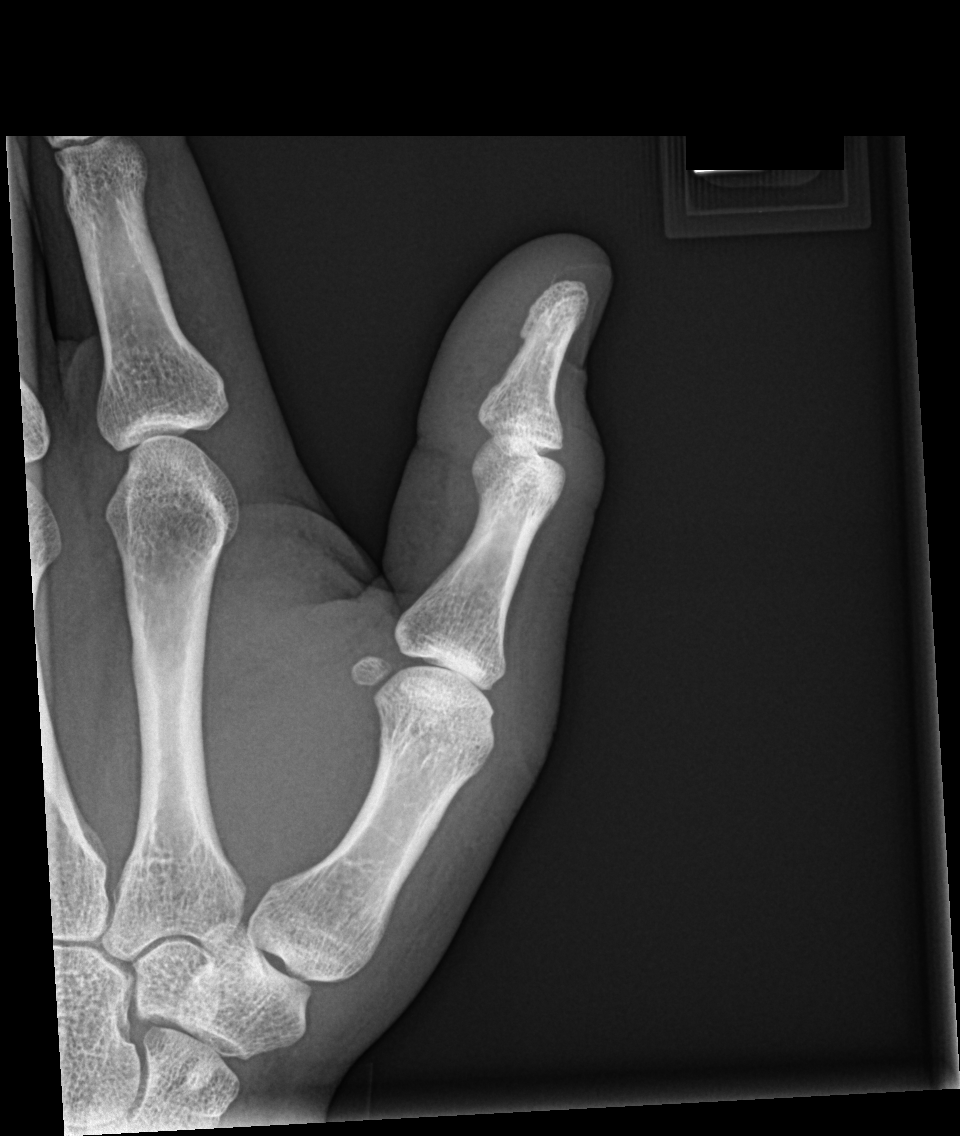

[finger lat]
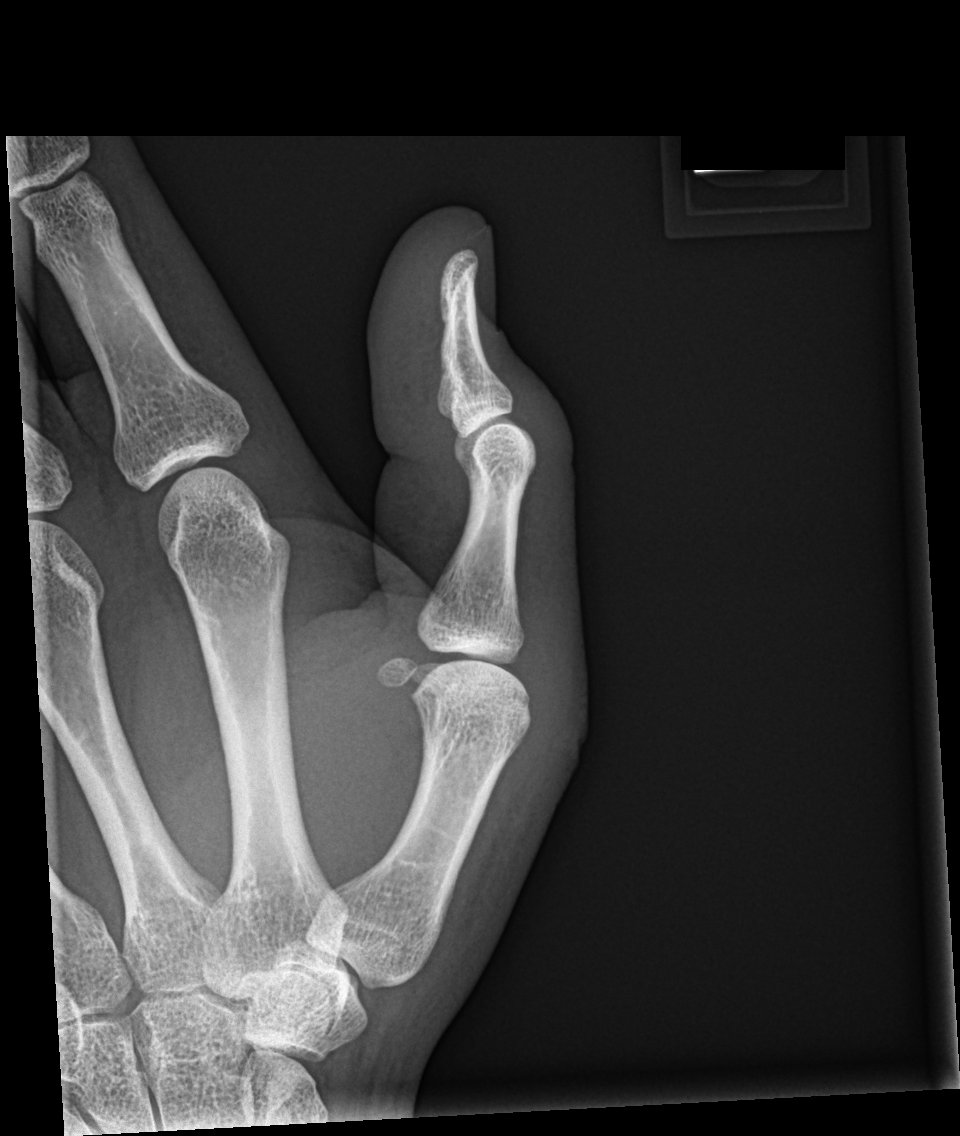

[3 of 3 positions shown; findings below may reference images not displayed]

FINDINGS: Normal alignment. No fracture or arthropathy. Diffuse soft tissue
swelling in the thumb. Negative for foreign body. Negative for
osteomyelitis.
IMPRESSION: Negative.
